# Patient Record
Sex: Female | Born: 1950
Health system: Southern US, Community
[De-identification: ages and names within clinical notes are randomized; demographics above are authoritative.]

## PROBLEM LIST (undated history)

## (undated) DIAGNOSIS — H532 Diplopia: Secondary | ICD-10-CM

## (undated) DIAGNOSIS — J4530 Mild persistent asthma, uncomplicated: Secondary | ICD-10-CM

## (undated) DIAGNOSIS — F419 Anxiety disorder, unspecified: Secondary | ICD-10-CM

## (undated) DIAGNOSIS — M1990 Unspecified osteoarthritis, unspecified site: Secondary | ICD-10-CM

## (undated) DIAGNOSIS — M5126 Other intervertebral disc displacement, lumbar region: Secondary | ICD-10-CM

## (undated) DIAGNOSIS — IMO0002 Reserved for concepts with insufficient information to code with codable children: Secondary | ICD-10-CM

## (undated) DIAGNOSIS — U071 COVID-19: Secondary | ICD-10-CM

## (undated) DIAGNOSIS — K635 Polyp of colon: Secondary | ICD-10-CM

## (undated) DIAGNOSIS — M779 Enthesopathy, unspecified: Secondary | ICD-10-CM

## (undated) DIAGNOSIS — E162 Hypoglycemia, unspecified: Secondary | ICD-10-CM

## (undated) DIAGNOSIS — M545 Low back pain, unspecified: Secondary | ICD-10-CM

## (undated) DIAGNOSIS — K5790 Diverticulosis of intestine, part unspecified, without perforation or abscess without bleeding: Secondary | ICD-10-CM

## (undated) DIAGNOSIS — F329 Major depressive disorder, single episode, unspecified: Secondary | ICD-10-CM

## (undated) DIAGNOSIS — J329 Chronic sinusitis, unspecified: Secondary | ICD-10-CM

## (undated) DIAGNOSIS — J189 Pneumonia, unspecified organism: Secondary | ICD-10-CM

## (undated) DIAGNOSIS — R7303 Prediabetes: Secondary | ICD-10-CM

## (undated) DIAGNOSIS — T7840XA Allergy, unspecified, initial encounter: Secondary | ICD-10-CM

## (undated) DIAGNOSIS — K219 Gastro-esophageal reflux disease without esophagitis: Secondary | ICD-10-CM

## (undated) DIAGNOSIS — F32A Depression, unspecified: Secondary | ICD-10-CM

## (undated) HISTORY — DX: Polyp of colon: K63.5

## (undated) HISTORY — DX: Reserved for concepts with insufficient information to code with codable children: IMO0002

## (undated) HISTORY — PX: FOOT SURGERY: SHX648

## (undated) HISTORY — DX: COVID-19: U07.1

## (undated) HISTORY — DX: Diplopia: H53.2

## (undated) HISTORY — PX: SHOULDER SURGERY: SHX246

## (undated) HISTORY — DX: Allergy, unspecified, initial encounter: T78.40XA

## (undated) HISTORY — DX: Mild persistent asthma, uncomplicated: J45.30

## (undated) HISTORY — DX: Low back pain, unspecified: M54.50

## (undated) HISTORY — DX: Depression, unspecified: F32.A

## (undated) HISTORY — PX: TUBAL LIGATION: SHX77

## (undated) HISTORY — DX: Chronic sinusitis, unspecified: J32.9

## (undated) HISTORY — DX: Gastro-esophageal reflux disease without esophagitis: K21.9

## (undated) HISTORY — DX: Pneumonia, unspecified organism: J18.9

## (undated) HISTORY — DX: Prediabetes: R73.03

## (undated) HISTORY — DX: Low back pain: M54.5

## (undated) HISTORY — DX: Diverticulosis of intestine, part unspecified, without perforation or abscess without bleeding: K57.90

## (undated) HISTORY — DX: Major depressive disorder, single episode, unspecified: F32.9

## (undated) HISTORY — DX: Hypoglycemia, unspecified: E16.2

## (undated) HISTORY — DX: Anxiety disorder, unspecified: F41.9

---

## 1998-04-02 ENCOUNTER — Other Ambulatory Visit: Admission: RE | Admit: 1998-04-02 | Discharge: 1998-04-02 | Payer: Self-pay | Admitting: Obstetrics and Gynecology

## 1998-06-01 ENCOUNTER — Other Ambulatory Visit: Admission: RE | Admit: 1998-06-01 | Discharge: 1998-06-01 | Payer: Self-pay | Admitting: Obstetrics and Gynecology

## 1998-08-15 ENCOUNTER — Ambulatory Visit (HOSPITAL_COMMUNITY): Admission: RE | Admit: 1998-08-15 | Discharge: 1998-08-15 | Payer: Self-pay | Admitting: Obstetrics and Gynecology

## 1999-02-01 ENCOUNTER — Other Ambulatory Visit: Admission: RE | Admit: 1999-02-01 | Discharge: 1999-02-01 | Payer: Self-pay | Admitting: Obstetrics and Gynecology

## 2000-06-23 ENCOUNTER — Other Ambulatory Visit: Admission: RE | Admit: 2000-06-23 | Discharge: 2000-06-23 | Payer: Self-pay | Admitting: Obstetrics and Gynecology

## 2000-12-18 ENCOUNTER — Other Ambulatory Visit: Admission: RE | Admit: 2000-12-18 | Discharge: 2000-12-18 | Payer: Self-pay | Admitting: Obstetrics and Gynecology

## 2002-01-07 ENCOUNTER — Encounter: Payer: Self-pay | Admitting: Internal Medicine

## 2002-01-07 ENCOUNTER — Ambulatory Visit (HOSPITAL_COMMUNITY): Admission: RE | Admit: 2002-01-07 | Discharge: 2002-01-07 | Payer: Self-pay | Admitting: Internal Medicine

## 2002-04-27 ENCOUNTER — Encounter: Admission: RE | Admit: 2002-04-27 | Discharge: 2002-04-27 | Payer: Self-pay | Admitting: Neurosurgery

## 2002-04-27 ENCOUNTER — Encounter: Payer: Self-pay | Admitting: Neurosurgery

## 2002-05-11 ENCOUNTER — Encounter: Admission: RE | Admit: 2002-05-11 | Discharge: 2002-05-11 | Payer: Self-pay | Admitting: Neurosurgery

## 2002-05-11 ENCOUNTER — Encounter: Payer: Self-pay | Admitting: Neurosurgery

## 2002-06-02 HISTORY — PX: UPPER GASTROINTESTINAL ENDOSCOPY: SHX188

## 2002-06-08 ENCOUNTER — Encounter: Admission: RE | Admit: 2002-06-08 | Discharge: 2002-06-08 | Payer: Self-pay | Admitting: Neurosurgery

## 2002-06-08 ENCOUNTER — Encounter: Payer: Self-pay | Admitting: Neurosurgery

## 2002-06-29 ENCOUNTER — Ambulatory Visit (HOSPITAL_COMMUNITY): Admission: RE | Admit: 2002-06-29 | Discharge: 2002-06-29 | Payer: Self-pay | Admitting: Internal Medicine

## 2002-06-29 ENCOUNTER — Encounter: Payer: Self-pay | Admitting: Internal Medicine

## 2004-09-18 ENCOUNTER — Emergency Department (HOSPITAL_COMMUNITY): Admission: EM | Admit: 2004-09-18 | Discharge: 2004-09-18 | Payer: Self-pay | Admitting: Emergency Medicine

## 2005-02-15 ENCOUNTER — Emergency Department (HOSPITAL_COMMUNITY): Admission: EM | Admit: 2005-02-15 | Discharge: 2005-02-16 | Payer: Self-pay | Admitting: Emergency Medicine

## 2005-02-19 ENCOUNTER — Ambulatory Visit: Payer: Self-pay | Admitting: Internal Medicine

## 2005-03-03 DIAGNOSIS — K635 Polyp of colon: Secondary | ICD-10-CM

## 2005-03-03 HISTORY — DX: Polyp of colon: K63.5

## 2005-10-30 ENCOUNTER — Ambulatory Visit: Payer: Self-pay | Admitting: Internal Medicine

## 2005-11-01 HISTORY — PX: COLONOSCOPY W/ POLYPECTOMY: SHX1380

## 2005-11-05 ENCOUNTER — Ambulatory Visit: Payer: Self-pay | Admitting: Internal Medicine

## 2005-11-13 ENCOUNTER — Ambulatory Visit: Payer: Self-pay | Admitting: Internal Medicine

## 2005-11-14 ENCOUNTER — Ambulatory Visit: Payer: Self-pay | Admitting: Internal Medicine

## 2005-11-27 ENCOUNTER — Ambulatory Visit: Payer: Self-pay | Admitting: Internal Medicine

## 2005-11-27 ENCOUNTER — Encounter (INDEPENDENT_AMBULATORY_CARE_PROVIDER_SITE_OTHER): Payer: Self-pay | Admitting: Specialist

## 2005-12-22 ENCOUNTER — Ambulatory Visit: Payer: Self-pay | Admitting: Internal Medicine

## 2006-01-12 ENCOUNTER — Ambulatory Visit: Payer: Self-pay | Admitting: Internal Medicine

## 2006-01-12 LAB — CONVERTED CEMR LAB
Fecal Occult Blood: NEGATIVE
OCCULT 1: NEGATIVE
OCCULT 2: NEGATIVE
OCCULT 3: NEGATIVE
OCCULT 4: NEGATIVE
OCCULT 5: NEGATIVE

## 2006-04-07 ENCOUNTER — Ambulatory Visit: Admission: RE | Admit: 2006-04-07 | Discharge: 2006-04-07 | Payer: Self-pay | Admitting: Gynecologic Oncology

## 2006-05-05 ENCOUNTER — Ambulatory Visit (HOSPITAL_COMMUNITY): Admission: RE | Admit: 2006-05-05 | Discharge: 2006-05-05 | Payer: Self-pay | Admitting: Obstetrics and Gynecology

## 2006-05-05 ENCOUNTER — Encounter (INDEPENDENT_AMBULATORY_CARE_PROVIDER_SITE_OTHER): Payer: Self-pay | Admitting: Specialist

## 2006-05-05 HISTORY — PX: LAPAROSCOPIC SALPINGOOPHERECTOMY: SUR795

## 2006-06-02 ENCOUNTER — Ambulatory Visit: Admission: RE | Admit: 2006-06-02 | Discharge: 2006-06-02 | Payer: Self-pay | Admitting: Gynecologic Oncology

## 2007-03-31 ENCOUNTER — Encounter: Payer: Self-pay | Admitting: Internal Medicine

## 2007-04-06 ENCOUNTER — Ambulatory Visit (HOSPITAL_BASED_OUTPATIENT_CLINIC_OR_DEPARTMENT_OTHER): Admission: RE | Admit: 2007-04-06 | Discharge: 2007-04-06 | Payer: Self-pay | Admitting: Orthopedic Surgery

## 2007-04-06 ENCOUNTER — Encounter: Payer: Self-pay | Admitting: Internal Medicine

## 2007-09-27 ENCOUNTER — Ambulatory Visit: Payer: Self-pay | Admitting: Internal Medicine

## 2007-09-28 LAB — CONVERTED CEMR LAB
ALT: 21 units/L (ref 0–35)
AST: 20 units/L (ref 0–37)
Albumin: 3.9 g/dL (ref 3.5–5.2)
Alkaline Phosphatase: 53 units/L (ref 39–117)
BUN: 12 mg/dL (ref 6–23)
Basophils Absolute: 0 10*3/uL (ref 0.0–0.1)
Basophils Relative: 0.7 % (ref 0.0–3.0)
Bilirubin Urine: NEGATIVE
Bilirubin, Direct: 0.1 mg/dL (ref 0.0–0.3)
CO2: 29 meq/L (ref 19–32)
Calcium: 9.4 mg/dL (ref 8.4–10.5)
Chloride: 108 meq/L (ref 96–112)
Cholesterol: 185 mg/dL (ref 0–200)
Creatinine, Ser: 0.7 mg/dL (ref 0.4–1.2)
Eosinophils Absolute: 0.3 10*3/uL (ref 0.0–0.7)
Eosinophils Relative: 7.1 % — ABNORMAL HIGH (ref 0.0–5.0)
GFR calc Af Amer: 112 mL/min
GFR calc non Af Amer: 93 mL/min
Glucose, Bld: 104 mg/dL — ABNORMAL HIGH (ref 70–99)
HCT: 39.5 % (ref 36.0–46.0)
HDL: 42.2 mg/dL (ref >39.0)
Hemoglobin: 13.3 g/dL (ref 12.0–15.0)
Ketones, ur: NEGATIVE mg/dL
LDL Cholesterol: 136 mg/dL — ABNORMAL HIGH (ref 0–99)
Leukocytes, UA: NEGATIVE
Lymphocytes Relative: 42.2 % (ref 12.0–46.0)
MCHC: 33.5 g/dL (ref 30.0–36.0)
MCV: 95.2 fL (ref 78.0–100.0)
Monocytes Absolute: 0.6 10*3/uL (ref 0.1–1.0)
Monocytes Relative: 12.3 % — ABNORMAL HIGH (ref 3.0–12.0)
Neutro Abs: 1.8 10*3/uL (ref 1.4–7.7)
Neutrophils Relative %: 37.7 % — ABNORMAL LOW (ref 43.0–77.0)
Nitrite: NEGATIVE
Platelets: 240 10*3/uL (ref 150–400)
Potassium: 4.4 meq/L (ref 3.5–5.1)
RBC: 4.15 M/uL (ref 3.87–5.11)
RDW: 12 % (ref 11.5–14.6)
Sodium: 144 meq/L (ref 135–145)
Specific Gravity, Urine: 1.02 (ref 1.000–1.03)
TSH: 2.56 microintl units/mL (ref 0.35–5.50)
Total Bilirubin: 0.7 mg/dL (ref 0.3–1.2)
Total CHOL/HDL Ratio: 4.4
Total Protein, Urine: NEGATIVE mg/dL
Total Protein: 6.6 g/dL (ref 6.0–8.3)
Triglycerides: 32 mg/dL (ref 0–149)
Urine Glucose: NEGATIVE mg/dL
Urobilinogen, UA: 0.2 (ref 0.0–1.0)
VLDL: 6 mg/dL (ref 0–40)
WBC: 4.9 10*3/uL (ref 4.5–10.5)
pH: 6 (ref 5.0–8.0)

## 2007-09-30 ENCOUNTER — Ambulatory Visit: Payer: Self-pay | Admitting: Internal Medicine

## 2007-09-30 DIAGNOSIS — M545 Low back pain, unspecified: Secondary | ICD-10-CM | POA: Insufficient documentation

## 2007-09-30 DIAGNOSIS — J309 Allergic rhinitis, unspecified: Secondary | ICD-10-CM | POA: Insufficient documentation

## 2007-09-30 DIAGNOSIS — K219 Gastro-esophageal reflux disease without esophagitis: Secondary | ICD-10-CM | POA: Insufficient documentation

## 2007-12-15 ENCOUNTER — Encounter: Payer: Self-pay | Admitting: Internal Medicine

## 2007-12-15 ENCOUNTER — Ambulatory Visit: Payer: Self-pay | Admitting: Internal Medicine

## 2007-12-21 ENCOUNTER — Telehealth: Payer: Self-pay | Admitting: Internal Medicine

## 2008-01-02 ENCOUNTER — Emergency Department (HOSPITAL_COMMUNITY): Admission: EM | Admit: 2008-01-02 | Discharge: 2008-01-02 | Payer: Self-pay | Admitting: Emergency Medicine

## 2008-01-02 ENCOUNTER — Encounter: Payer: Self-pay | Admitting: Internal Medicine

## 2008-01-02 DIAGNOSIS — R079 Chest pain, unspecified: Secondary | ICD-10-CM | POA: Insufficient documentation

## 2008-01-10 ENCOUNTER — Encounter: Payer: Self-pay | Admitting: Internal Medicine

## 2008-01-10 ENCOUNTER — Ambulatory Visit: Payer: Self-pay

## 2008-04-18 ENCOUNTER — Ambulatory Visit: Payer: Self-pay | Admitting: Internal Medicine

## 2008-04-18 DIAGNOSIS — J45909 Unspecified asthma, uncomplicated: Secondary | ICD-10-CM | POA: Insufficient documentation

## 2008-04-18 DIAGNOSIS — J209 Acute bronchitis, unspecified: Secondary | ICD-10-CM | POA: Insufficient documentation

## 2008-04-18 DIAGNOSIS — H532 Diplopia: Secondary | ICD-10-CM | POA: Insufficient documentation

## 2008-04-19 ENCOUNTER — Telehealth (INDEPENDENT_AMBULATORY_CARE_PROVIDER_SITE_OTHER): Payer: Self-pay | Admitting: *Deleted

## 2008-04-19 ENCOUNTER — Telehealth: Payer: Self-pay | Admitting: Internal Medicine

## 2009-01-03 ENCOUNTER — Encounter: Payer: Self-pay | Admitting: Internal Medicine

## 2010-04-04 NOTE — Consult Note (Signed)
Summary: [facility]  [facility]   Imported By: Esmeralda Links D'jimraou 04/20/2007 15:09:32  _____________________________________________________________________  External Attachment:    Type:   Image     Comment:   External Document

## 2010-04-04 NOTE — Miscellaneous (Signed)
Summary: Orders Update  Clinical Lists Changes  Problems: Added new problem of CHEST PAIN (ICD-786.50) Orders: Added new Referral order of Cardiolite (Cardiolite) - Signed

## 2010-04-04 NOTE — Progress Notes (Signed)
Summary: results  Phone Note Call from Patient Call back at Home Phone [phone]   Caller: Patient Summary of Call: pt requesting results from bone density Initial call taken by: Migdalia Dk,  December 21, 2007 4:37 PM  Follow-up for Phone Call        I don't have it Follow-up by: Tresa Garter MD,  December 22, 2007 12:36 PM  Additional Follow-up for Phone Call Additional follow up Details #1::        Report not in emr yet Additional Follow-up by: Lamar Sprinkles,  December 22, 2007 2:11 PM    Additional Follow-up for Phone Call Additional follow up Details #2::    Report scanned in ................Marland KitchenLamar Sprinkles  December 28, 2007 9:48 AM   Additional Follow-up for Phone Call Additional follow up Details #3:: Details for Additional Follow-up Action Taken: Mild osteopeniia. Take Vit D and exercise.  Pt informed ..............Marland KitchenLamar Sprinkles  December 29, 2007 3:49 PM  Additional Follow-up by: Tresa Garter MD,  December 28, 2007 5:33 PM

## 2010-04-04 NOTE — Assessment & Plan Note (Signed)
Summary: CPX/$50/BCBS/PN   Vital Signs:  Patient Profile:   60 Years Old Female Weight:      163.0 pounds Temp:     97.5 degrees F oral Pulse rate:   60 / minute BP sitting:   118 / 70  (left arm) Cuff size:   regular  Pt. in pain?   no  Vitals Entered By: Rock Nephew CMA (September 30, 2007 10:59 AM)                  Chief Complaint:  cpx.  History of Present Illness: The patient presents for a wellness examination     Past Medical History:    Low back pain    L foot lig tear Dr Antony Odea    Esoph stricture  Dr Leone Payor    GERD    Allergic rhinitis    Gyn Dr Ambrose Mantle   Family History:    M MM  Social History:    Occupation: office    Married re    Never Smoked    Alcohol use-no   Risk Factors:  Tobacco use:  never Alcohol use:  no   Review of Systems  The patient denies anorexia, fever, weight loss, weight gain, vision loss, decreased hearing, hoarseness, chest pain, syncope, dyspnea on exertion, peripheral edema, prolonged cough, headaches, hemoptysis, abdominal pain, melena, hematochezia, severe indigestion/heartburn, hematuria, incontinence, genital sores, muscle weakness, suspicious skin lesions, transient blindness, difficulty walking, depression, unusual weight change, abnormal bleeding, enlarged lymph nodes, angioedema, and breast masses.     Physical Exam  General:     Well-developed,well-nourished,in no acute distress; alert,appropriate and cooperative throughout examination Head:     Normocephalic and atraumatic without obvious abnormalities. No apparent alopecia or balding. Eyes:     No corneal or conjunctival inflammation noted. EOMI. Perrla. Funduscopic exam benign, without hemorrhages, exudates or papilledema. Vision grossly normal. Ears:     External ear exam shows no significant lesions or deformities.  Otoscopic examination reveals clear canals, tympanic membranes are intact bilaterally without bulging, retraction, inflammation or  discharge. Hearing is grossly normal bilaterally. Nose:     External nasal examination shows no deformity or inflammation. Nasal mucosa are pink and moist without lesions or exudates. Mouth:     Oral mucosa and oropharynx without lesions or exudates.  Teeth in good repair. Neck:     No deformities, masses, or tenderness noted. Lungs:     Normal respiratory effort, chest expands symmetrically. Lungs are clear to auscultation, no crackles or wheezes. Heart:     Normal rate and regular rhythm. S1 and S2 normal without gallop, murmur, click, rub or other extra sounds. Abdomen:     Bowel sounds positive,abdomen soft and non-tender without masses, organomegaly or hernias noted. Msk:     No deformity or scoliosis noted of thoracic or lumbar spine.   Pulses:     R and L carotid,radial,femoral,dorsalis pedis and posterior tibial pulses are full and equal bilaterally Extremities:     No clubbing, cyanosis, edema, or deformity noted with normal full range of motion of all joints.   Neurologic:     No cranial nerve deficits noted. Station and gait are normal. Plantar reflexes are down-going bilaterally. DTRs are symmetrical throughout. Sensory, motor and coordinative functions appear intact. Skin:     Intact without suspicious lesions or rashes Cervical Nodes:     No lymphadenopathy noted Inguinal Nodes:     No significant adenopathy Psych:     Cognition and judgment appear intact. Alert  and cooperative with normal attention span and concentration. No apparent delusions, illusions, hallucinations    Impression & Recommendations:  Problem # 1:  Preventive Health Care (ICD-V70.0) Age related issues discussed  Problem # 2:  GERD (ICD-530.81) On Rx  Problem # 3:  ALLERGIC RHINITIS (ICD-477.9) Assessment: Unchanged  Her updated medication list for this problem includes:    Flonase 50 Mcg/act Susp (Fluticasone propionate) ..... Use 1-2 spray once daily prn   Problem # 4:  LOW BACK PAIN  (ICD-724.2) Assessment: Improved  Complete Medication List: 1)  Flonase 50 Mcg/act Susp (Fluticasone propionate) .... Use 1-2 spray once daily prn 2)  Vitamin D3 1000 Unit Tabs (Cholecalciferol) .Marland Kitchen.. 1 by mouth daily 3)  Mebendazole 100 Mg Chew (Mebendazole) .Marland Kitchen.. 1 by mouth bid   Patient Instructions: 1)  Please schedule a follow-up appointment in 1 year well/labs.   Prescriptions: MEBENDAZOLE 100 MG  CHEW (MEBENDAZOLE) 1 by mouth bid  #6 x 0   Entered and Authorized by:   Tresa Garter MD   Signed by:   Tresa Garter MD on 09/30/2007   Method used:   Print then Give to Patient   RxID:   [id number] FLONASE 50 MCG/ACT  SUSP (FLUTICASONE PROPIONATE) use 1-2 spray once daily prn  #3 x 3   Entered and Authorized by:   Tresa Garter MD   Signed by:   Tresa Garter MD on 09/30/2007   Method used:   Print then Give to Patient   RxID:   [id number]  ]

## 2010-04-04 NOTE — Progress Notes (Signed)
Summary: Appointment  Phone Note Outgoing Call   Call placed by: Dagoberto Reef,  April 19, 2008 8:57 AM Summary of Call: Dr Posey Rea, Dr Dagoberto Ligas is not accepting new patients made pt an appt with Dr Janet Berlin on 05/05/08 @ 2:30pm.Is this ok ? Initial call taken by: Dagoberto Reef,  April 19, 2008 8:58 AM  Follow-up for Phone Call        OK. Thx! Follow-up by: Tresa Garter MD,  April 19, 2008 1:02 PM

## 2010-04-04 NOTE — Consult Note (Signed)
Summary: [facility]  [facility]   Imported By: Esmeralda Links D'jimraou 04/08/2007 15:28:22  _____________________________________________________________________  External Attachment:    Type:   Image     Comment:   External Document

## 2010-04-04 NOTE — Assessment & Plan Note (Signed)
Summary: WHEEZY COUGH X 4 WKS---$50---STC   Vital Signs:  Patient Profile:   60 Years Old Female Weight:      173 pounds Temp:     97.6 degrees F oral Pulse rate:   80 / minute BP sitting:   120 / 80  (left arm)  Vitals Entered By: Tora Perches (April 18, 2008 4:28 PM)                 Chief Complaint:  Multiple medical problems or concerns.  History of Present Illness: The patient presents with complaints of sore throat, fever, cough, sinus congestion and drainge of 4 wks duration. Not better with OTC meds. Chest hurts with coughing. Can't sleep due to cough. Muscle aches are present.  The mucus is colored.     Prior Medications Reviewed Using: Patient Recall  Updated Prior Medication List: FLONASE 50 MCG/ACT  SUSP (FLUTICASONE PROPIONATE) use 1-2 spray once daily prn VITAMIN D3 1000 UNIT  TABS (CHOLECALCIFEROL) 1 by mouth daily  Current Allergies (reviewed today): ! SULFA  Past Medical History:    Reviewed history from 09/30/2007 and no changes required:       Low back pain       L foot lig tear Dr Antony Odea       Esoph stricture  Dr Leone Payor       GERD       Allergic rhinitis       Gyn Dr Ambrose Mantle       Asthma       L eye cataract   Family History:    Reviewed history from 09/30/2007 and no changes required:       M MM  Social History:    Reviewed history from 09/30/2007 and no changes required:       Occupation: office       Married re       Never Smoked       Alcohol use-no    Review of Systems       The patient complains of dyspnea on exertion and prolonged cough.  The patient denies fever.     Physical Exam  General:     Well-developed,well-nourished,in no acute distress; alert,appropriate and cooperative throughout examination Eyes:     L eye is deviating vision grossly intact, pupils equal, pupils round, and pupils reactive to light.   Ears:     External ear exam shows no significant lesions or deformities.  Otoscopic examination  reveals clear canals, tympanic membranes are intact bilaterally without bulging, retraction, inflammation or discharge. Hearing is grossly normal bilaterally. Nose:     Erythematous throat mucosa and intranasal erythema.  Lungs:     CTA Heart:     RRR Abdomen:     Bowel sounds positive,abdomen soft and non-tender without masses, organomegaly or hernias noted. Msk:     No deformity or scoliosis noted of thoracic or lumbar spine.   Neurologic:     No cranial nerve deficits noted. Station and gait are normal. Plantar reflexes are down-going bilaterally. DTRs are symmetrical throughout. Sensory, motor and coordinative functions appear intact.    Impression & Recommendations:  Problem # 1:  BRONCHITIS, ACUTE (ICD-466.0) Assessment: New  Her updated medication list for this problem includes:    Levaquin 500 Mg Tabs (Levofloxacin) .Marland Kitchen... Take 1 tab by mouth daily    Promethazine-codeine 6.25-10 Mg/78ml Syrp (Promethazine-codeine) .Marland Kitchen... Take 5-32mls by mouth every 4-6 hours as needed for cough    Proair Hfa 108 (90  Base) Mcg/act Aers (Albuterol sulfate) .Marland Kitchen... 2 inh q4h as needed shortness of breath  Orders: T-2 View CXR, Same Day (71020.5TC)   Problem # 2:  ASTHMA (ICD-493.90) Assessment: Deteriorated Predn Her updated medication list for this problem includes:    Prednisone 10 Mg Tabs (Prednisone) .Marland Kitchen... Take 40mg  qd for 3 days, then 20 mg qd for 3 days, then 10mg  qd for 6 days, then stop. take pc.    Proair Hfa 108 (90 Base) Mcg/act Aers (Albuterol sulfate) .Marland Kitchen... 2 inh q4h as needed shortness of breath   Problem # 3:  DIPLOPIA (ICD-368.2) Assessment: Deteriorated  Orders: Ophthalmology Referral (Ophthalmology)   Problem # 4:  CHEST PAIN (ICD-786.50) Assessment: New  Orders: T-2 View CXR, Same Day (71020.5TC)   Complete Medication List: 1)  Flonase 50 Mcg/act Susp (Fluticasone propionate) .... Use 1-2 spray once daily prn 2)  Vitamin D3 1000 Unit Tabs (Cholecalciferol)  .Marland Kitchen.. 1 by mouth daily 3)  Prednisone 10 Mg Tabs (Prednisone) .... Take 40mg  qd for 3 days, then 20 mg qd for 3 days, then 10mg  qd for 6 days, then stop. take pc. 4)  Levaquin 500 Mg Tabs (Levofloxacin) .... Take 1 tab by mouth daily 5)  Promethazine-codeine 6.25-10 Mg/54ml Syrp (Promethazine-codeine) .... Take 5-22mls by mouth every 4-6 hours as needed for cough 6)  Proair Hfa 108 (90 Base) Mcg/act Aers (Albuterol sulfate) .... 2 inh q4h as needed shortness of breath   Patient Instructions: 1)  Call if you are not better in a reasonable ammount of time or if worse. Go to ER if feeling really bad!   Prescriptions: PROAIR HFA 108 (90 BASE) MCG/ACT  AERS (ALBUTEROL SULFATE) 2 inh q4h as needed shortness of breath  #1 x 3   Entered and Authorized by:   Tresa Garter MD   Signed by:   Tresa Garter MD on 04/18/2008   Method used:   Print then Give to Patient   RxID:   [id number] PROMETHAZINE-CODEINE 6.25-10 MG/5ML  SYRP (PROMETHAZINE-CODEINE) Take 5-69mls by mouth every 4-6 hours as needed for cough  #333mls x 0   Entered and Authorized by:   Tresa Garter MD   Signed by:   Tresa Garter MD on 04/18/2008   Method used:   Print then Give to Patient   RxID:   [id number] LEVAQUIN 500 MG TABS (LEVOFLOXACIN) Take 1 tab by mouth daily  #10 x 1   Entered and Authorized by:   Tresa Garter MD   Signed by:   Tresa Garter MD on 04/18/2008   Method used:   Print then Give to Patient   RxID:   [id number] PREDNISONE 10 MG  TABS (PREDNISONE) Take 40mg  qd for 3 days, then 20 mg qd for 3 days, then 10mg  qd for 6 days, then stop. Take pc.  #24 x 0   Entered and Authorized by:   Tresa Garter MD   Signed by:   Tresa Garter MD on 04/18/2008   Method used:   Print then Give to Patient   RxID:   [id number]

## 2010-04-04 NOTE — Miscellaneous (Signed)
Summary: BONE DENSITY  Clinical Lists Changes  Orders: Added new Test order of T-Bone Densitometry (77080) - Signed Added new Test order of T-Lumbar Vertebral Assessment (77082) - Signed 

## 2010-04-04 NOTE — Miscellaneous (Signed)
Summary: Immunization Entry   Immunization History:  Influenza Immunization History:    Influenza:  fluvax 3+ (12/21/2008) vaccine given at [facility]  s. main in [place]/ [place]. PT/VG

## 2010-07-16 NOTE — Op Note (Signed)
NAMENARIA, ABBEY                 ACCOUNT NO.:  [id number]   MEDICAL RECORD NO.:  [id number]          PATIENT TYPE:  AMB   LOCATION:  DSC                          FACILITY:  [facility]   PHYSICIAN:  Leonides Grills, M.D.     DATE OF BIRTH:  [birth year]   DATE OF PROCEDURE:  04/06/2007  DATE OF DISCHARGE:                               OPERATIVE REPORT   PREOPERATIVE DIAGNOSES:  1. Left peroneus brevis tendon tear.  2. Left subluxing peroneal tendons.  3. Left cavus foot.   POSTOPERATIVE DIAGNOSES:  1. Left peroneus brevis tendon tear.  2. Left subluxing peroneal tendons.  3. Left cavus foot.   OPERATION:  1. Repair of left subluxing peroneal tendons with fibular osteotomy.  2. Repair of left peroneus brevis tendon tear.  3. Left peroneus longus to peroneus brevis tendon transfer.  4. Left dorsiflexion first metatarsal osteotomy.  5. Stress x-rays, left foot.   ANESTHESIA:  General.   SURGEON:  Leonides Grills, M.D.   ASSISTANT:  Richardean Canal, P.A.-C.   ESTIMATED BLOOD LOSS:  Minimal.   TOURNIQUET TIME:  Approximately an hour and a half.   COMPLICATIONS:  None.   DISPOSITION:  Stable to PR.   INDICATIONS:  This is a 60 year old female with a cavus foot deformity  and posterolateral ankle pain secondary to peroneus brevis tendon tear  and local synovitis and subluxation of her tendons.  She was consented  for the above procedure.  All risks, including infection, nerve or  vessel injury, persistent pain, worsening pain, prolonged recovery,  weakness, stiffness were all explained.  Questions were encouraged and  answered.   OPERATION:  The patient was brought to the operating room and placed in  the supine position.  After adequate general endotracheal tube  anesthesia had been administered as well as Ancef 1 gram IV piggyback, a  beanbag was used as a bump, and she was placed in a sloppy lateral  position with the operative side up.  All bony prominences were well-  padded.   The left lower extremity was then prepped and draped in a  sterile manner over a proximally placed thigh tourniquet.  The limb was  gravity exsanguinated, and the  tourniquet was elevated to 290 mmHg.   A curvilinear incision made over the left peroneal tendons.  Dissection  was carried down through skin.  Hemostasis was obtained.  The peroneal  retinaculum was incised approximately 2 mm from the posterolateral edge  of the lateral malleolus.  There was a large amount of synovitis within  the tendons.  The peroneus brevis tendon was very flat, thickened, and  had a tear in its substance.  Also, the muscle belly of the peroneus  brevis was distalized as well down to the tip of the lateral malleolus.  The local synovitis was then debrided, and a formal tenosynovectomy and  partial removal of the muscle belly to about 3-4 cm proximal to the tip  of the lateral malleolus.  We then debrided any diseased tissue from the  tear and then dissected down distally to both tendons, releasing the  inferior peroneal retinaculum as well.  We then dissected the peroneus  longus down to the cuboid group and tenotomized it in this area.  We  then repaired the peroneus brevis tear using 2-0 FiberWire stitch and  then we transferred the peroneus longus to peroneus brevis using 2-0  FiberWire stitch.  This had an Conservation officer, historic buildings.  This was transferred  with the ankle in neutral dorsiflexion.  The area was copiously  irrigated with normal saline.  We then palpated the posterior aspect of  the lateral malleolus.  There was concavity to this area.  We then did a  fibular osteotomy as described by Dr. Tillman Sers where we actually  drilled with a 3.5-mm drill up the lateral malleolus and then tamped the  posterior convexity into a concavity and created a beautiful area for  the tendon on the posterior aspect of the lateral malleolus.  We then  also prepared the posterolateral edge of the lateral malleolus  with a  curved quarter-inch osteotome and rongeur and then placed multiple drill  holes on the posterolateral edge of the lateral malleolus.  We then  copiously irrigated the area with normal saline, tucked the tendons back  into place, and then repaired the peroneal retinaculum using 2-0  FiberWire stitch into this trough that was created.  This had an  Conservation officer, historic buildings.  We ranged the ankle, and there was excellent range  of motion and excursion of the tendons.  We then under C-arm guidance  identified the first TMT joint.  We then made a longitudinal incision  just distal to this.  This was made dorsal medial on the base of the  first metatarsal just medial to the EHL tendon.  We protected the EHL  tendon within its respective sheath.  Subperiosteal elevation was done  on the base of the first metatarsal.  We then identified approximately a  centimeter or so distal to the first TMT joint, and a closing wedge  osteotomy was then made with a sagittal saw.  This bone resection was  removed, and with a two-point reduction clamp, we then closed down the  osteotomy.  We then applied a mini frag stainless steel Synthes four-  hole T plate dorsally and then fixed this with four 2.0-mm fully  threaded cortical set screws using 1.5-mm drill holes, respectively.  This had excellent purchase and maintenance of the correction across the  osteotomy site.  We obtained stress x-ray in PA and lateral planes which  showed no gross motion, fixation and proposition and excellent as well  at the osteotomy.  The tourniquet was deflated.  Hemostasis was  obtained.  All wounds were irrigated with normal saline.  The subcu was  closed with 3-0 Vicryl, and the skin was closed with 4-0 nylon.  A  sterile dressing was applied.  A modified Jones dressing was applied  with the ankle in dorsiflexion.  The patient was stable to the PR.      Leonides Grills, M.D.  Electronically Signed     PB/MEDQ  D:   04/06/2007  T:  04/06/2007  Job:  [id number]

## 2010-07-19 NOTE — Assessment & Plan Note (Signed)
[facility]                           GASTROENTEROLOGY OFFICE NOTE   NAME:[name] [name] EVEN                        MRN:          [id number]  DATE:12/22/2005                            DOB:          15-May-1950    [name] returns after an incomplete colonoscopy.  On November 27, 2005,  she had a screening colonoscopy that showed a 4-mm hyperplastic polyp.  I  got to the transverse colon, I think.  She had an extremely redundant left  colon.  It was very difficult.  I spent 30 minutes trying the enter the  colon.  We switched scopes.  Despite 50 mcg fentanyl and 9 mg versed she  says she remembers all of it.  There is no family history of colon cancer.   ASSESSMENT:  Negative incomplete screening colonoscopy.  She is 60 years old  with no increased risk factors or symptoms.   Options are repeat a colonoscopy - perhaps with the Spirus device, CT  colonoscopy, barium enema, hemoccults, or nothing.   After hearing a description of the risks and benefits of all, she has opted  to try hemoccults now.  If she is hemoccult positive we will pursue things  further at this time and determine a modality of investigation.  If not,  then will regroup in a year to see what options are available.  This is an  extremely difficult colonoscopy to perform, the Spirus device may provide  benefit.  At least approximately half of her colon is free of colorectal  neoplasia.  She understands the possibility of other colorectal neoplasia  remaining in the colon at this time, but has opted for this approach.       Iva Boop, MD,FACG      CEG/MedQ  DD:  12/22/2005  DT:  12/23/2005  Job #:  [id number]   cc:   Georgina Quint. Plotnikov, MD

## 2010-07-19 NOTE — Consult Note (Signed)
[name] [name], MUMFORD                 ACCOUNT NO.:  [id number]   MEDICAL RECORD NO.:  [id number]          PATIENT TYPE:  OUT   LOCATION:  GYN                          FACILITY:  [facility]   PHYSICIAN:  Paola A. Duard Brady, MD    DATE OF BIRTH:  [birth year]   DATE OF CONSULTATION:  06/02/2006  DATE OF DISCHARGE:                                 CONSULTATION   The patient is 60 year old who was seen by [place] secondary to an ultrasound  that revealed a left ovarian cyst measuring 6  x 5 x 4.75 cm.  It was  described as having low level echoes scattered throughout the lumen of  the mass.  There is a 2.2 x 2 cm lobulated vascular excrescence and a 7  mm wall calcification.  CA-125 was normal at 12.  After evaluation and  discussion she opted for laparoscopic approach in evaluation.  She  underwent a laparoscopic left salpingo-oophorectomy on May 05, 2006.  Based on the patient's request, the uterus and contralateral adnexa were  left in situ if this was a benign process and they appeared negative.  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  There is no malignancy.  The washings were negative.  She  comes in today for her postoperative check.  She complained of abdominal  pain and swelling for a few weeks.  She states she did not bounce back  as quickly she thought she would and has not yet returned to work.  She  is eating well and denies any other complaints.   PHYSICAL EXAMINATION:  Weight 146 pounds, well-nourished, well-developed  female in no acute distress.  ABDOMEN:  Shows well-healed laparoscopy skin incisions.  Abdomen is soft  and nontender.   ASSESSMENT:  This is a 60 year old status post laparoscopic left  oophorectomy for benign disease.  She is doing well and we will release  her to her usual activities.  We will also release her to go back to  work and a note was given to  her that effect.  She knows that we will  release her to Dr. Ambrose Mantle but we will  be happy to see her in the future  should the need arise.      Paola A. Duard Brady, MD  Electronically Signed     PAG/MEDQ  D:  06/02/2006  T:  06/02/2006  Job:  [id number]   cc:   Malachi Pro. Ambrose Mantle, M.D.  Fax: [phone]   Telford Nab, R.N.  [address]  [place], [place] [address]

## 2010-07-19 NOTE — Assessment & Plan Note (Signed)
[name] [name]                             PRIMARY CARE OFFICE NOTE   NAME:[name] [name] BLUMENTHAL                        MRN:          [id number]  DATE:11/05/2005                            DOB:          09-05-1950    The patient is a 60 year old female who presents for a wellness examination.   PAST MEDICAL HISTORY:  Low back pain and radiculopathy on the right side.  Esophageal stenosis.   FAMILY HISTORY:  Mother with multiple myeloma, now 4 years old.  Uncle just  had lung cancer.   SOCIAL HISTORY:  Nonsmoker.  No alcohol.   REVIEW OF SYSTEMS:  Occasional low back pain.  No chest pain or shortness of  breath, occasionally allergy symptoms.  No blood in the stools.  The rest is  negative.   PHYSICAL EXAMINATION:  VITAL SIGNS:  Blood pressure 94/64.  Pulse 66.  Temp  98.2.  Weight 146 pounds.  HEENT:  With moist mucosa.  NECK:  Supple.  No thyromegaly or bruits.  LUNGS:  Clear.  No wheezes or rales.  HEART:  S1, S2 normal with no gallop.  ABDOMEN:  Soft, nontender.  No organomegaly.  No mass felt.  EXTREMITIES:  Lower extremities without edema.  NEUROLOGIC:  She is alert, oriented, and cooperative.  Denies being  depressed.  SKIN:  Clear.   Labs on October 03, 2005, CBC normal, cholesterol 177, LDL 124, HDL 47,  glucose 956.  CMET normal.  TSH normal.  Urinalysis normal.  EKG is normal  today.   ASSESSMENT AND PLAN:  1. __________, health issues discussed.  Healthy lifestyle discussed.  She      will need a colonoscopy.  Start a baby aspirin daily.  Fish oil to      improve LDL.  Regular gynecology care with Dr. Ambrose [name].  We will obtain      bone density scan.  Advised to take calcium and vitamin D daily.  2. Low back pain.  She quit Celebrex.  Gave her tramadol 50 mg, 1 to 2,      q.i.d. p.r.n., however, she refused.  She will use Tylenol.  3. Allergic rhinitis.  She will use Allegra 180 mg daily p.r.n. and      Flonase nasal spray.  I will see  her back in 6 months.                                   [name] Quint. Plotnikov, MD   AVP/MedQ  DD:  11/07/2005  DT:  11/07/2005  Job #:  [id number]   cc:   [name] [name], M.D.

## 2010-07-19 NOTE — Op Note (Signed)
[name] [name], BRENT                 ACCOUNT NO.:  [id number]   MEDICAL RECORD NO.:  [id number]          PATIENT TYPE:  AMB   LOCATION:  DAY                          FACILITY:  [facility]   PHYSICIAN:  Paola A. Duard Brady, MD    DATE OF BIRTH:  [birth year]   DATE OF PROCEDURE:  05/05/2006  DATE OF DISCHARGE:                               OPERATIVE REPORT   PREOPERATIVE DIAGNOSIS:  Persistent complex left adnexal mass.   POSTOPERATIVE DIAGNOSIS:  Benign serous cystadenofibroma.   PROCEDURE:  Laparoscopic left salpingo-oophorectomy.   SURGEONS:  Paola A. Duard Brady, MD and Malachi Pro. Ambrose Mantle, M.D.   ANESTHESIA:  General.   ANESTHESIOLOGIST:  Jenelle Mages. Fortune, M.D.   SPECIMENS:  Left tube and ovary to pathology.   ESTIMATED BLOOD LOSS:  Minimal.   URINE OUTPUT:  300 mL.   IV FLUIDS:  2000 mL.   COMPLICATIONS:  None.   OPERATIVE FINDINGS:  Included  an 8 cm simple appearing left adnexal  mass.  The uterus, right adnexa and appendix were unremarkable.   The patient was taken to the operating room and placed in the supine  position and arms were tucked to her comfort level with all appropriate  precautions.  General anesthesia was induced without difficulty.  She  was then placed in dorsal lithotomy position.  The abdomen was prepped  in the usual sterile fashion.  The perineum was prepped in usual sterile  fashion as was the vagina.  Foley catheter was inserted into the bladder  under sterile conditions.  A sterile speculum was inserted into the  vagina.  The cervix was grasped with a single-tooth tenaculum and cervix  was dilated without difficulty.  The ZUMI was placed into the uterus.  Time-out was then performed to confirm the patient, the procedure, and  ALLERGY TO SULFA.  The patient was then draped.  A vertical umbilical  incision was made with a knife after injecting 0.25% Marcaine.  It was  carried down to the underlying fascia using Mayo scissors.  The fascia  was identified,  grasped with Kocher clamps, tented and entered sharply.  Once peritoneal cavity entry was confirmed UR6 suture was placed to  suture the fascial edges.  The Hasson was then placed.  The abdomen was  insufflated with CO2 gas.  Never did the patient's peak pressure exceed  50 mmHg.  She was then placed in Trendelenburg.  Operative findings as  above were noted.  We then proceeded to place our accessory ports.  After confirming the appropriate procedure, a 5 mm trocar was placed 2  cm superior and medial to the anterior-superior iliac spine.  The skin  was anesthetized with 0.25% Marcaine.  A 10/12 suprapubic port was  placed two fingerbreadths above the pubic symphysis and a right-sided  lateral 5-mm port was placed in the same manner as the left.  Again all  ports were placed under direct visualization.  The uterus was deviated  over towards the patient's right with the ZUMI.  Abdominal pelvic  washings were obtained without difficulty.  The broad ligament on the  patient's left side was opened using monopolar cautery.  The ureter was  identified in the retroperitoneal space.  A window was created between  the IP and the ureter.  The IP was then coagulated with bipolar cautery  and transected.  We then skeletonized the utero-ovarian pedicle.  The  fallopian tube and the utero-ovarian pedicle were coagulated with  bipolar cautery and transected with monopolar cautery.  An EndoCatch bag  was then placed through the 10/12 suprapubic port and the mass was  placed in the EndoCatch bag.  The EndoCatch was delivered up to the  skin.  The cyst was drained was no spillage into the abdomen and was  delivered through the 10/12 port.  The mass was sent for frozen section.  Frozen section returned as a serous cystadenofibroma.  At this time we  inspected all the pedicles under low flow and they were noted to be  hemostatic.  The pelvis was irrigated.  Under direct visualization all  accessory ports were  removed and the port sites were noted to be  hemostatic.  The CO2 was released from the abdomen and the [facility] port  was removed as was the camera.  The fascial incision at the umbilicus  was reapproximated with stay sutures and additional figure-of-eight  suture of zero Vicryl was used to close the umbilical incision.  The  subcu suture was placed at the 10/12 suprapubic port.  Both the 10/12  suprapubic port and the infraumbilical port skin was closed using a 4-0  Vicryl.  All other ports were closed with Steri-Strips.  Steri-Strips  and Band-Aids were placed with all ports.   The patient tolerated procedure well.  All needle, Ray-Tec counts were  correct x2.  She was taken to recovery room in stable condition.      Paola A. Duard Brady, MD  Electronically Signed     PAG/MEDQ  D:  05/05/2006  T:  05/05/2006  Job:  [id number]   cc:   Telford Nab, R.N.  [address]  [place], [place] [address]   Malachi Pro. Ambrose Mantle, M.D.  Fax: [phone]

## 2010-07-19 NOTE — Consult Note (Signed)
[name] [name], CARR                 ACCOUNT NO.:  [id number]   MEDICAL RECORD NO.:  [id number]          PATIENT TYPE:  OUT   LOCATION:  GYN                          FACILITY:  [facility]   PHYSICIAN:  John T. Kyla Balzarine, M.D.    DATE OF BIRTH:  1950/10/12   DATE OF CONSULTATION:  DATE OF DISCHARGE:                                 CONSULTATION   CHIEF COMPLAINT:  This 60 year old woman is seen at the request of Dr.  Ambrose Mantle for recommendations regarding management of a left ovarian cyst.   HISTORY OF PRESENT ILLNESS:  The patient had an abnormality noted on  pelvis, questionable enlarged uterus or adnexal mass.  Subsequent  ultrasound on March 09, 2006 revealed a left ovarian cyst measuring 6 x  approximately 5 x 4.75 cm. There was no free fluid, endometrial stripe  was 4.21 mm thick and the right ovary was normal.  The cyst was  described as smoothly marginated with faint low level echoes scattered  throughout the lumen.  A 2.2 x 2.0 cm lobulated vascular excrescence  was seen as well as a 7 mm wall calcification.  CA-125 value was 12.  The patient denies significant symptoms.  She has multiple questions  regarding surgical management.   PAST MEDICAL HISTORY:  Significant for no major comorbidities.  She is  status post BTL and NSVD x1.   MEDICATIONS:  Flonase p.r.n. environmental allergies.   ALLERGIES:  None.   PERSONAL SOCIAL HISTORY:  The patient is single, denies tobacco and  admits to occasional ethanol.   FAMILY HISTORY:  The patient's mother has multiple myeloma in her 37s  and a paternal cousin has premenopausal breast cancer.  No siblings or  parents with breast, gynecologic or colon cancer.   REVIEW OF SYSTEMS:  The patient went through menopause in approximately  2000 and was on hormone replacement therapy until approximately 2004.  She denies menopausal bleeding.  Otherwise negative in the remainder of  10 system review.   PHYSICAL EXAMINATION:  VITAL SIGNS:  Weight  151.5 pounds, blood pressure  104/68, pulse 84, respirations 18, temperature afebrile.  GENERAL:  The patient anxious, alert and oriented x3 in no acute  distress.  ENT:  Benign with clear oropharynx.  The extraocular movements are full  and there is no scleral icterus.  NECK:  Supple without goiter.  LUNG FIELDS:  Clear.  HEART:  Regular rate and rhythm with no JVD.  LYMPH NODE SURVEY:  Negative for pathologic lymphadenopathy.  BACK:  No back or CVA tenderness.  ABDOMEN:  Soft and benign with no ascites, mass, tenderness or hernia.  EXTREMITIES:  Full strength and range of motion without Homans, cords or  edema.  SKIN:  No suspicious lesions.  NEUROLOGIC SCREEN:  Intact.  PELVIC:  External genitalia and BUS normal to inspection and palpation.  The bladder and urethra are normal.  Vagina and cervix are without  lesions and there is no cervical motion tenderness.  Bimanual and  rectovaginal examinations reveals the cervix deviated anterior.  On  bimanual and rectovaginal examinations, there is a 6+ centimeter  cystic  and mildly tender mass best appreciated on rectovaginal.  No cul-de-sac  nodularity.   ASSESSMENT:  Menopausal adnexal cystic mass with internal excrescence  and normal CA-125.   RECOMMENDATIONS:  The patient wishes conservative treatment if possible.  I recommended a laparoscopic approach, which might be converted to an  open TAH/BSO with staging or debulking if ovarian malignancy were  encountered.  The patient is fairly adamant that she would wish only  removal of the affected tube and ovary if this were benign disease.  I  answered multiple questions regarding the procedure including the risks  and benefits.  The procedure will be scheduled to be performed by my  partner, Dr. Cleda Mccreedy, in conjunction with Dr. Ambrose Mantle.  The patient  is aware of this and consents to this.      John T. Kyla Balzarine, M.D.  Electronically Signed     JTS/MEDQ  D:  04/07/2006  T:   04/07/2006  Job:  [id number]   cc:   Malachi Pro. Ambrose Mantle, M.D.  Fax: [phone]

## 2010-09-16 ENCOUNTER — Encounter: Payer: Self-pay | Admitting: Internal Medicine

## 2010-09-17 ENCOUNTER — Encounter: Payer: Self-pay | Admitting: Internal Medicine

## 2010-09-17 ENCOUNTER — Ambulatory Visit (INDEPENDENT_AMBULATORY_CARE_PROVIDER_SITE_OTHER): Payer: BC Managed Care – PPO | Admitting: Internal Medicine

## 2010-09-17 DIAGNOSIS — K219 Gastro-esophageal reflux disease without esophagitis: Secondary | ICD-10-CM

## 2010-09-17 DIAGNOSIS — J019 Acute sinusitis, unspecified: Secondary | ICD-10-CM | POA: Insufficient documentation

## 2010-09-17 DIAGNOSIS — J45909 Unspecified asthma, uncomplicated: Secondary | ICD-10-CM

## 2010-09-17 DIAGNOSIS — J309 Allergic rhinitis, unspecified: Secondary | ICD-10-CM

## 2010-09-17 MED ORDER — FLUTICASONE PROPIONATE 50 MCG/ACT NA SUSP: 2.0000 | Freq: Every day | NASAL | Status: DC | PRN

## 2010-09-17 MED ORDER — LORATADINE 10 MG PO TABS: 10.0000 mg | ORAL_TABLET | Freq: Every day | ORAL | Status: DC
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
## 2010-09-17 NOTE — Assessment & Plan Note (Signed)
Start Rx 

## 2010-09-17 NOTE — Progress Notes (Signed)
  Subjective:    Patient ID: [name] [name], female    DOB: 07/06/1950, 60 y.o.   MRN: [id number]  HPI  C/o sinus thick and yellow d/c C/o wt/gain C/o GERD C/o congested breathing  Review of Systems  Constitutional: Negative for chills, activity change, appetite change, fatigue and unexpected weight change.  HENT: Negative for congestion, mouth sores and sinus pressure.   Eyes: Negative for visual disturbance.  Respiratory: Negative for cough and chest tightness.   Gastrointestinal: Negative for nausea and abdominal pain.  Genitourinary: Negative for frequency, difficulty urinating and vaginal pain.  Musculoskeletal: Negative for back pain and gait problem.  Skin: Negative for pallor and rash.  Neurological: Negative for dizziness, tremors, weakness, numbness and headaches.  Psychiatric/Behavioral: Negative for confusion and sleep disturbance.       Objective:   Physical Exam  Constitutional: She appears well-developed and well-nourished. No distress.  HENT:  Head: Normocephalic.  Right Ear: External ear normal.  Left Ear: External ear normal.  Nose: Nose normal.  Mouth/Throat: Oropharynx is clear and moist. No oropharyngeal exudate.       eryth nasal mucosa  Eyes: Conjunctivae are normal. Pupils are equal, round, and reactive to light. Right eye exhibits no discharge. Left eye exhibits no discharge.  Neck: Normal range of motion. Neck supple. No JVD present. No tracheal deviation present. No thyromegaly present.  Cardiovascular: Normal rate, regular rhythm and normal heart sounds.   Pulmonary/Chest: No stridor. No respiratory distress. She has no wheezes.  Abdominal: Soft. Bowel sounds are normal. She exhibits no distension and no mass. There is no tenderness. There is no rebound and no guarding.  Musculoskeletal: She exhibits no edema and no tenderness.  Lymphadenopathy:    She has no cervical adenopathy.  Neurological: She displays normal reflexes. No cranial nerve  deficit. She exhibits normal muscle tone. Coordination normal.  Skin: No rash noted. No erythema.  Psychiatric: She has a normal mood and affect. Her behavior is normal. Judgment and thought content normal.          Assessment & Plan:

## 2010-09-17 NOTE — Assessment & Plan Note (Signed)
Doing well 

## 2010-09-18 ENCOUNTER — Encounter: Payer: Self-pay | Admitting: Internal Medicine

## 2010-10-01 ENCOUNTER — Encounter: Payer: Self-pay | Admitting: Internal Medicine

## 2010-10-02 ENCOUNTER — Telehealth: Payer: Self-pay | Admitting: Internal Medicine

## 2010-10-02 NOTE — Telephone Encounter (Signed)
Patient has been rescheduled for 10/09/10

## 2010-10-09 ENCOUNTER — Ambulatory Visit (INDEPENDENT_AMBULATORY_CARE_PROVIDER_SITE_OTHER): Payer: BC Managed Care – PPO | Admitting: Internal Medicine

## 2010-10-09 ENCOUNTER — Telehealth: Payer: Self-pay | Admitting: *Deleted

## 2010-10-09 ENCOUNTER — Encounter: Payer: Self-pay | Admitting: Internal Medicine

## 2010-10-09 VITALS — BP 112/72 | HR 80 | Ht 66.0 in | Wt 164.0 lb

## 2010-10-09 DIAGNOSIS — Z1211 Encounter for screening for malignant neoplasm of colon: Secondary | ICD-10-CM

## 2010-10-09 DIAGNOSIS — J019 Acute sinusitis, unspecified: Secondary | ICD-10-CM

## 2010-10-09 DIAGNOSIS — K219 Gastro-esophageal reflux disease without esophagitis: Secondary | ICD-10-CM

## 2010-10-09 DIAGNOSIS — Z9283 Personal history of failed moderate sedation: Secondary | ICD-10-CM

## 2010-10-09 MED ORDER — RANITIDINE HCL 150 MG PO TABS: ORAL_TABLET | ORAL | Status: DC

## 2010-10-09 MED ORDER — AZITHROMYCIN 250 MG PO TABS: ORAL_TABLET | ORAL | Status: AC

## 2010-10-09 NOTE — Telephone Encounter (Signed)
Rx sent in, left vm for pt to check w/her pharm

## 2010-10-09 NOTE — Telephone Encounter (Signed)
Pt c/o increase in sinus congestion with thick green mucus. She is req rx for antibiotic.

## 2010-10-09 NOTE — Assessment & Plan Note (Addendum)
She actually seems to be well treated for this. Her globus sensation could be related but I'm not convinced. GERD could be contributing to her sinus difficulties. I don't think endoscopy is indicated. She is going to continue the Prilosec and add Zantac at bedtime, using the over-the-counter medication 75-150. I will see her in 2 months to reassess.

## 2010-10-09 NOTE — Progress Notes (Signed)
  Subjective:    Patient ID: [name] [name], female    DOB: 1950/10/15, 60 y.o.   MRN: [id number]  HPI he is complaining of a flare of heartburn and reflux symptoms. She had done well for a number of years, having last seen me for this over 5 years ago. She had come off her PPI but over the past several months she had developed increasing heartburn and reflux. She went on Prilosec OTC daily with significant benefit but she called our office and was advised, while waiting for an appointment, to try that twice a day because of persistent complaints of dysphagia and what is really a globus sensation. She developed crampy abdominal pain and diarrhea with the twice daily PPI in lumen through a spell where she had nausea vomiting and loose stools recently, and after several days of twice a day PPI she stopped that.  She denies any real heartburn at this time on her Prilosec. She has a globus sensation. She was seen for sinusitis problems about 3 weeks ago by her PCP and was treated for allergic issues but continues with problems with nasal congestion and what sounds like sinus drainage. He has some mild headache problems with that, above the eyes bilaterally.  She claims she gets more than 3 hours before lying down after eating. She is reducing caffeine and using only one dose or 2 doses a day.  Other issues are that she's been quite busy helping her father after mother died from Clostridium difficile colitis early 2012. This is been quite stressful for her.    Review of Systems Positive for back pain, dyspnea, insomnia issues with disturbed sleep from symptoms. Otherwise all other review of systems negative.    Objective:   Physical Exam  Constitutional: She appears well-developed and well-nourished.  HENT:       Mild posterior pharyngeal cobblestoning Sounds congested  Eyes: Conjunctivae are normal. No scleral icterus.  Neck: Normal range of motion. Neck supple. No thyromegaly present.    Cardiovascular: Normal rate, regular rhythm and normal heart sounds.   Pulmonary/Chest: Effort normal and breath sounds normal.       Some coughing  Abdominal: Soft. Bowel sounds are normal. She exhibits no distension and no mass. There is no tenderness.  Musculoskeletal: She exhibits no edema.  Lymphadenopathy:    She has no cervical adenopathy.  Skin: Skin is warm and dry.  Psychiatric: She has a normal mood and affect.          Assessment & Plan:

## 2010-10-09 NOTE — Telephone Encounter (Signed)
Ok zpac OV if not better Thx

## 2010-10-09 NOTE — Patient Instructions (Signed)
Take Zantac 75 mg every night. Return in 2 months to see Dr. Leone Payor. With with your primary care physician about your sinus problems and other treatment.

## 2010-10-09 NOTE — Assessment & Plan Note (Signed)
She seems to continue to be struggling with this, the exact cause not clear. Question allergies question infection or both and admittedly reflux could be influencing this. She will ask Dr. Posey Rea if other treatment is indicated, specifically antibiotics.

## 2010-10-09 NOTE — Assessment & Plan Note (Signed)
Incomplete colonoscopy in 11/2005. Redundant colon and ? If sedation adequate - she says it was not. She is willing to attempt repeat optical colonoscopy with MAC - propofol sedation. Will discuss at follow-up in 2 months - does not want to schedule now.

## 2010-10-10 ENCOUNTER — Encounter: Payer: Self-pay | Admitting: Internal Medicine

## 2010-11-07 ENCOUNTER — Ambulatory Visit: Payer: BC Managed Care – PPO | Admitting: Internal Medicine

## 2010-11-19 ENCOUNTER — Other Ambulatory Visit: Payer: Self-pay | Admitting: Allergy and Immunology

## 2010-11-19 ENCOUNTER — Ambulatory Visit
Admission: RE | Admit: 2010-11-19 | Discharge: 2010-11-19 | Disposition: A | Discharge status: Home / Self Care | Payer: BC Managed Care – PPO | Source: Ambulatory Visit | Attending: Allergy and Immunology | Admitting: Allergy and Immunology

## 2010-11-19 DIAGNOSIS — R05 Cough: Secondary | ICD-10-CM

## 2010-11-19 DIAGNOSIS — R059 Cough, unspecified: Secondary | ICD-10-CM

## 2010-12-03 LAB — POCT I-STAT, CHEM 8
BUN: 13
Calcium, Ion: 1.19
Chloride: 106
Creatinine, Ser: 0.9
Glucose, Bld: 139 — ABNORMAL HIGH
HCT: 43
Hemoglobin: 14.6
Potassium: 4.1
Sodium: 140
TCO2: 25

## 2010-12-03 LAB — POCT CARDIAC MARKERS
CKMB, poc: <1 — ABNORMAL LOW
Myoglobin, poc: 34.6
Troponin i, poc: <0.05

## 2010-12-03 LAB — D-DIMER, QUANTITATIVE: D-Dimer, Quant: <0.22

## 2010-12-17 ENCOUNTER — Other Ambulatory Visit: Payer: Self-pay | Admitting: Allergy and Immunology

## 2010-12-17 ENCOUNTER — Ambulatory Visit
Admission: RE | Admit: 2010-12-17 | Discharge: 2010-12-17 | Disposition: A | Discharge status: Home / Self Care | Payer: BC Managed Care – PPO | Source: Ambulatory Visit | Attending: Allergy and Immunology | Admitting: Allergy and Immunology

## 2010-12-17 DIAGNOSIS — J329 Chronic sinusitis, unspecified: Secondary | ICD-10-CM

## 2010-12-31 ENCOUNTER — Ambulatory Visit: Payer: BC Managed Care – PPO | Admitting: Internal Medicine

## 2011-07-17 ENCOUNTER — Telehealth: Payer: Self-pay | Admitting: Internal Medicine

## 2011-07-17 NOTE — Telephone Encounter (Signed)
Left message for patient to call back  

## 2011-07-18 NOTE — Telephone Encounter (Signed)
Patient was to have 2 month follow up after office visit in 10/2010.  I have scheduled an office visit for her for 08/26/11 to discuss

## 2011-08-26 ENCOUNTER — Ambulatory Visit (INDEPENDENT_AMBULATORY_CARE_PROVIDER_SITE_OTHER): Payer: BC Managed Care – PPO | Admitting: Internal Medicine

## 2011-08-26 ENCOUNTER — Encounter: Payer: Self-pay | Admitting: Internal Medicine

## 2011-08-26 VITALS — BP 108/68 | HR 60 | Ht 66.0 in | Wt 170.4 lb

## 2011-08-26 DIAGNOSIS — Z1211 Encounter for screening for malignant neoplasm of colon: Secondary | ICD-10-CM

## 2011-08-26 DIAGNOSIS — K219 Gastro-esophageal reflux disease without esophagitis: Secondary | ICD-10-CM

## 2011-08-26 NOTE — Patient Instructions (Addendum)
Please let us know when you are ready to set up colonoscopy.

## 2011-08-26 NOTE — Progress Notes (Signed)
Patient ID: [name] [name], female   DOB: 03/30/1950, 61 y.o.   MRN: [id number]  The patient presents to discuss a repeat screening colonoscopy in a followup with GERD problems. She uses Prilosec 20 mg daily and notes that that controls her heartburn well. She thought the nightly ranitidine she had been using was causing some diarrhea so she stopped that and has not had nocturnal complaints of heartburn or reflux. She does note that she eliminated starchy foods and breads and sugary foods she could go without the Prilosec but has had a hard time maintaining that diet when eating out so went back to the Prilosec.  She previously had a colonoscopy in 2007, moderate sedation was not completely adequate, the colon was redundant and she had some pain in the left side and it was not completed. She has not schedule followup or repeat colonoscopy. We did discuss using propofol sedation when I saw her last year. She is closer to scheduling this but her brother is ill in on the respirator in the intensive care unit at [facility] she is not ready to do so yet. She is also asking about possible CT colonoscopy or other options at any rate and we discussed that as well. She tells me she has been doing annual Hemoccult testing through her gynecologist, Dr. Ambrose Mantle and that has been negative over the years. She has no active GI complaints at this time.   Allergies  Allergen Reactions  . Sulfonamide Derivatives    Outpatient Prescriptions Prior to Visit  Medication Sig Dispense Refill  . albuterol (PROAIR HFA) 108 (90 BASE) MCG/ACT inhaler Inhale 2 puffs into the lungs every 4 (four) hours as needed.        . Cholecalciferol (VITAMIN D3) 1000 UNITS tablet Take 1,000 Units by mouth daily.        . fish oil-omega-3 fatty acids 1000 MG capsule Take 2 g by mouth daily.        . fluticasone (FLONASE) 50 MCG/ACT nasal spray Place 2 sprays into the nose daily as needed.  48 g  3  . omeprazole (PRILOSEC OTC) 20 MG  tablet Take 20 mg by mouth daily.        Marland Kitchen loratadine (CLARITIN) 10 MG tablet Take 1 tablet (10 mg total) by mouth daily.  100 tablet  3  . Phenylephrine-APAP-Guaifenesin (MUCINEX FAST-MAX COLD & SINUS PO) Take 1 each by mouth as directed.        . pseudoephedrine (SUDAFED) 30 MG tablet Take 30 mg by mouth every 4 (four) hours as needed.        .         Past Medical History  Diagnosis Date  . Lumbago     and radiculopathy  . Ligament tear of lower extremity     L. foot Dr. Antony Odea  . GERD (gastroesophageal reflux disease)   . Allergic rhinitis   . Asthma   . Cataract     L. eye  . Bronchitis   . Diplopia   . Anxiety and depression   . Hypoglycemia   . Diverticulosis   . Sessile colonic polyp     4mm   Past Surgical History  Procedure Date  . Tubal ligation   . Shoulder surgery     impingement syndrome  . Upper gastrointestinal endoscopy 06/2002,     with dilation, esophageal stenosis  . Colonoscopy w/ polypectomy 11/2005    INCOMPLETE - to transverse colon  - diverticulosis, 4mm polyp  was hyperplastic  . Foot surgery     left - Dr. Lestine Box 2009  . Laparoscopic salpingoopherectomy 05/05/2006    serous cystadenofibroma   History   Social History  . Marital Status: Married    Spouse Name: N/A    Number of Children: 1  .     Occupational History  . office [employer]   Social History Main Topics  . Smoking status: Never Smoker   . Smokeless tobacco: Never Used  . Alcohol Use: No  . Drug Use: No  .       Family History  Problem Relation Age of Onset  . Heart disease Mother 50    CHF  . Multiple myeloma Mother   . Colon polyps Mother   . Diabetes Mother   . Breast cancer      cousin  . Lung cancer      uncle  . Colon cancer Neg Hx    Physical exam limited to general appearance and vital signs. He is well-developed well-nourished in no acute distress.   Assessment and plan:  1. GERD (gastroesophageal reflux disease)   Continue Prilosec 20 mg daily, work  on diet.   2. Special screening for malignant neoplasms, colon   Last colonoscopy 2007 with diminutive left-sided hyperplastic polyp but incomplete exam. Hemoccults are negative on an annual basis. I explained that that is in acceptable way to screen for colorectal cancer. She is considering CT versus optical colonoscopy and will let us know if she chooses to do one of those.    I appreciate the opportunity to care for this patient.  CC: Tracey Harries M.D.

## 2011-10-15 ENCOUNTER — Encounter: Payer: Self-pay | Admitting: Family Medicine

## 2011-10-15 ENCOUNTER — Telehealth: Payer: Self-pay | Admitting: Family Medicine

## 2011-10-15 ENCOUNTER — Ambulatory Visit (INDEPENDENT_AMBULATORY_CARE_PROVIDER_SITE_OTHER): Payer: BC Managed Care – PPO | Admitting: Family Medicine

## 2011-10-15 VITALS — BP 109/72 | HR 69 | Temp 96.8°F | Ht 66.0 in | Wt 171.5 lb

## 2011-10-15 DIAGNOSIS — Z1211 Encounter for screening for malignant neoplasm of colon: Secondary | ICD-10-CM

## 2011-10-15 DIAGNOSIS — G5600 Carpal tunnel syndrome, unspecified upper limb: Secondary | ICD-10-CM

## 2011-10-15 DIAGNOSIS — Z0000 Encounter for general adult medical examination without abnormal findings: Secondary | ICD-10-CM | POA: Insufficient documentation

## 2011-10-15 DIAGNOSIS — R635 Abnormal weight gain: Secondary | ICD-10-CM | POA: Insufficient documentation

## 2011-10-15 DIAGNOSIS — R7401 Elevation of levels of liver transaminase levels: Secondary | ICD-10-CM

## 2011-10-15 DIAGNOSIS — R7402 Elevation of levels of lactic acid dehydrogenase (LDH): Secondary | ICD-10-CM

## 2011-10-15 DIAGNOSIS — Z23 Encounter for immunization: Secondary | ICD-10-CM

## 2011-10-15 DIAGNOSIS — G4700 Insomnia, unspecified: Secondary | ICD-10-CM

## 2011-10-15 LAB — LIPID PANEL
Cholesterol: 186 mg/dL (ref 0–200)
HDL: 47.5 mg/dL (ref >39.00)
LDL Cholesterol: 124 mg/dL — ABNORMAL HIGH (ref 0–99)
Total CHOL/HDL Ratio: 4
Triglycerides: 72 mg/dL (ref 0.0–149.0)
VLDL: 14.4 mg/dL (ref 0.0–40.0)

## 2011-10-15 LAB — COMPREHENSIVE METABOLIC PANEL
ALT: 47 U/L — ABNORMAL HIGH (ref 0–35)
AST: 30 U/L (ref 0–37)
Albumin: 4.2 g/dL (ref 3.5–5.2)
Alkaline Phosphatase: 47 U/L (ref 39–117)
BUN: 16 mg/dL (ref 6–23)
CO2: 26 mEq/L (ref 19–32)
Calcium: 9.3 mg/dL (ref 8.4–10.5)
Chloride: 105 mEq/L (ref 96–112)
Creatinine, Ser: 0.5 mg/dL (ref 0.4–1.2)
GFR: 131.17 mL/min (ref >60.00)
Glucose, Bld: 94 mg/dL (ref 70–99)
Potassium: 4.1 mEq/L (ref 3.5–5.1)
Sodium: 139 mEq/L (ref 135–145)
Total Bilirubin: 0.5 mg/dL (ref 0.3–1.2)
Total Protein: 6.8 g/dL (ref 6.0–8.3)

## 2011-10-15 LAB — CBC WITH DIFFERENTIAL/PLATELET
Basophils Absolute: 0 10*3/uL (ref 0.0–0.1)
Basophils Relative: 0.4 % (ref 0.0–3.0)
Eosinophils Absolute: 0.2 10*3/uL (ref 0.0–0.7)
Eosinophils Relative: 5.1 % — ABNORMAL HIGH (ref 0.0–5.0)
HCT: 41 % (ref 36.0–46.0)
Hemoglobin: 13.5 g/dL (ref 12.0–15.0)
Lymphocytes Relative: 40.3 % (ref 12.0–46.0)
Lymphs Abs: 1.8 10*3/uL (ref 0.7–4.0)
MCHC: 32.8 g/dL (ref 30.0–36.0)
MCV: 96 fl (ref 78.0–100.0)
Monocytes Absolute: 0.3 10*3/uL (ref 0.1–1.0)
Monocytes Relative: 7.8 % (ref 3.0–12.0)
Neutro Abs: 2 10*3/uL (ref 1.4–7.7)
Neutrophils Relative %: 46.4 % (ref 43.0–77.0)
Platelets: 224 10*3/uL (ref 150.0–400.0)
RBC: 4.27 Mil/uL (ref 3.87–5.11)
RDW: 13 % (ref 11.5–14.6)
WBC: 4.4 10*3/uL — ABNORMAL LOW (ref 4.5–10.5)

## 2011-10-15 LAB — TSH: TSH: 2.25 u[IU]/mL (ref 0.35–5.50)

## 2011-10-15 NOTE — Progress Notes (Signed)
Office Note 10/17/2011  CC:  Chief Complaint  Patient presents with  . Annual Exam    Not sleeping well, worried about wt gain, 30 lb in last 4 years.  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     HPI:  [name] [name] is a 61 y.o. White female who is here as transfer from Dr. Posey Rea at [facility] on [address]. Old records in EPIC/HL reviewed prior to or during today's visit.  Pt c/o problems with wt gain over the last few years: at least 30 lbs in last 4 yrs. Gives hx of no specific dieting, reports exercise that is intermittent mostly.  Admits to periods of overeating and 'mindless eating'.    Describes poor sleep hs.  Initiates sleep decently, but wakes up several times a night with sweating mainly in her back, gets up to use the restroom and has trouble getting back to sleep.  Also says she wakes up a fair amount of the time with numbness in hands, takes minutes to go away.  Admits to sleeping with wrists cocked/flexed.     Past Medical History  Diagnosis Date  . Lumbago     and radiculopathy  . Ligament tear of lower extremity     L. foot Dr. Antony Odea  . GERD (gastroesophageal reflux disease)   . Allergic rhinitis   . Asthma   . Cataract     L. eye  . Diplopia   . Anxiety and depression   . Hypoglycemia   . Diverticulosis   . Sessile colonic polyp 2007    4mm  . Chronic sinusitis     CT sinuses 2012, moderately severe.    Past Surgical History  Procedure Date  . Tubal ligation   . Shoulder surgery     impingement syndrome  . Upper gastrointestinal endoscopy 06/2002,     with dilation, esophageal stenosis  . Colonoscopy w/ polypectomy 11/2005    INCOMPLETE - to transverse colon  - diverticulosis, 4mm polyp was hyperplastic  . Foot surgery     left - Dr. Lestine Box 2009  . Laparoscopic salpingoopherectomy 05/05/2006    serous cystadenofibroma    Family History  Problem Relation Age of Onset  . Heart disease Mother 38    CHF with valvular dz  . Multiple  myeloma Mother   . Colon polyps Mother   . Diabetes Mother   . Cancer Mother     multiple myel  . Breast cancer      cousin  . Lung cancer      uncle  . Colon cancer Neg Hx   . Heart disease Brother     one with aortic valve dz and one with CAD    History   Social History  . Marital Status: Married    Spouse Name: N/A    Number of Children: 1  . Years of Education: N/A   Occupational History  . office [employer]   Social History Main Topics  . Smoking status: Never Smoker   . Smokeless tobacco: Never Used  . Alcohol Use: No  . Drug Use: No  . Sexually Active: Not on file   Other Topics Concern  . 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  No drug use.Exercise is rare for her.  Takes care of her 13 y/o father.Diet: not limiting calories.  Outpatient Encounter Prescriptions as of 10/15/2011  Medication Sig Dispense Refill  . albuterol (PROAIR HFA) 108 (90 BASE) MCG/ACT inhaler Inhale 2 puffs into the lungs every 4 (four) hours as needed.        . calcium carbonate (OS-CAL) 600 MG TABS Take 600 mg by mouth daily.      . Cholecalciferol (VITAMIN D3) 1000 UNITS tablet Take 1,000 Units by mouth daily.        . fish oil-omega-3 fatty acids 1000 MG capsule Take 2 g by mouth daily.        . Melatonin 1 MG TABS Take 1 mg by mouth daily.      . Multiple Vitamin (MULTIVITAMIN) tablet Take 1 tablet by mouth daily.      Marland Kitchen omeprazole (PRILOSEC OTC) 20 MG tablet Take 20 mg by mouth daily.        . fluticasone (FLONASE) 50 MCG/ACT nasal spray Place 2 sprays into the nose daily as needed.  48 g  3  . meloxicam (MOBIC) 7.5 MG tablet       . montelukast (SINGULAIR) 10 MG tablet Take 10 mg by mouth at bedtime.        Allergies  Allergen Reactions  . Sulfonamide Derivatives     ROS Review of Systems  Constitutional: Negative for  fever, chills, appetite change and fatigue.  HENT: Negative for ear pain, congestion, sore throat, neck stiffness and dental problem.   Eyes: Negative for discharge, redness and visual disturbance.  Respiratory: Negative for cough, chest tightness, shortness of breath and wheezing.   Cardiovascular: Negative for chest pain, palpitations and leg swelling.  Gastrointestinal: Negative for nausea, vomiting, abdominal pain, diarrhea and blood in stool.  Genitourinary: Negative for dysuria, urgency, frequency, hematuria, flank pain and difficulty urinating.  Musculoskeletal: Negative for myalgias, back pain, joint swelling and arthralgias.  Skin: Negative for pallor and rash.  Neurological: Negative for dizziness, speech difficulty, weakness and headaches.  Hematological: Negative for adenopathy. Does not bruise/bleed easily.  Psychiatric/Behavioral: Negative for confusion and disturbed wake/sleep cycle. The patient is not nervous/anxious.     PE; Blood pressure 109/72, pulse 69, temperature 96.8 F (36 C), height 5\' 6"  (1.676 m), weight 171 lb 8 oz (77.792 kg), SpO2 98.00%. Gen: Alert, well appearing.  Patient is oriented to person, place, time, and situation. AFFECT: pleasant.  LUcid thought and speech. ENT: Ears: EACs clear, normal epithelium.  TMs with good light reflex and landmarks bilaterally.  Eyes: no injection, icteris, swelling, or exudate.  EOMI, PERRLA. Nose: no drainage or turbinate edema/swelling.  No injection or focal lesion.  Mouth: lips without lesion/swelling.  Oral mucosa pink and moist.  Dentition intact and without obvious caries or gingival swelling.  Oropharynx without erythema, exudate, or swelling.  Neck: supple/nontender.  No LAD, mass, or TM.  Carotid pulses 2+ bilaterally, without bruits. CV: RRR, no m/r/g.   LUNGS: CTA bilat, nonlabored resps, good aeration in all lung fields. ABD: soft, NT, ND, BS normal.  No hepatospenomegaly or mass.  No bruits. EXT: no clubbing,  cyanosis, or edema.  Modified phalen's testing was negative bilaterally.  No muscle wasting of hands.  No hypertrophy of wrists or finger joints.  Wrist ROM fully intact. Skin - no sores or suspicious lesions or rashes or color changes  Pertinent labs:  None today  ASSESSMENT AND PLAN:   Transfer patient:  Health maintenance examination Reviewed age and gender appropriate health maintenance issues (prudent diet, regular exercise, health risks of tobacco and excessive  alcohol, use of seatbelts, fire alarms in home, use of sunscreen).  Also reviewed age and gender appropriate health screening as well as vaccine recommendations.  She'll be due for Pap and mammogram through her GYN soon. Tdap given today. iFob given for colon cancer screening (she is currently trying to decide on how to proceed as far as her next procedural check for this problem--see recent GI notes.  Carpal tunnel syndrome Her symptoms fit this, but exam today normal. I recommended a trial of night-time wrist splinting bilaterally and see if this helps decrease the incidence of her sx's and thereby improve her sleep.  I dispensed/fitted her with bilateral wrist splints today in the office.  Insomnia Maintenance insomnia, mainly associated with waking up with "hot feeling back" and also sometimes numbness in hands. She is somewhat averse to the idea of medications and would like to avoid them if at all possible. I don't think hormonal hot flashes are the cause, but I did suggest she try some OTC black cohosh for a while. I also reiterated the importance of sleep hygiene and we reviewed good/normal sleep hygiene.  Weight gain, abnormal We reviewed some basics of healthy lifestyle, emphasizing eating instinctively (only when hungry, stopping the instant she feels full), finding something enjoyable to do for exercise and not putting too much pressure on herself to lose wt rapidly.  Avoid "yo yo" dieting.  Check routine fasting  health panel today.  Return in about 3 months (around 01/15/2012) for f/u wt issues, CTS, sleep issues.

## 2011-10-15 NOTE — Patient Instructions (Addendum)
Buy over the [employer]. Wear your wrist splints every night.Health Maintenance, Females A healthy lifestyle and preventative care can promote health and wellness.  Maintain regular health, dental, and eye exams.   Eat a healthy diet. Foods like vegetables, fruits, whole grains, low-fat dairy products, and lean protein foods contain the nutrients you need without too many calories. Decrease your intake of foods high in solid fats, added sugars, and salt. Get information about a proper diet from your caregiver, if necessary.   Regular physical exercise is one of the most important things you can do for your health. Most adults should get at least 150 minutes of moderate-intensity exercise (any activity that increases your heart rate and causes you to sweat) each week. In addition, most adults need muscle-strengthening exercises on 2 or more days a week.    Maintain a healthy weight. The body mass index (BMI) is a screening tool to identify possible weight problems. It provides an estimate of body fat based on height and weight. Your caregiver can help determine your BMI, and can help you achieve or maintain a healthy weight. For adults 20 years and older:   A BMI below 18.5 is considered underweight.   A BMI of 18.5 to 24.9 is normal.   A BMI of 25 to 29.9 is considered overweight.   A BMI of 30 and above is considered obese.   Maintain normal blood lipids and cholesterol by exercising and minimizing your intake of saturated fat. Eat a balanced diet with plenty of fruits and vegetables. Blood tests for lipids and cholesterol should begin at age 71 and be repeated every 5 years. If your lipid or cholesterol levels are high, you are over 50, or you are a high risk for heart disease, you may need your cholesterol levels checked more frequently.Ongoing high lipid and cholesterol levels should be treated with medicines if diet and exercise are not effective.   If you smoke, find out from  your caregiver how to quit. If you do not use tobacco, do not start.   If you are pregnant, do not drink alcohol. If you are breastfeeding, be very cautious about drinking alcohol. If you are not pregnant and choose to drink alcohol, do not exceed 1 drink per day. One drink is considered to be 12 ounces (355 mL) of beer, 5 ounces (148 mL) of wine, or 1.5 ounces (44 mL) of liquor.   Avoid use of street drugs. Do not share needles with anyone. Ask for help if you need support or instructions about stopping the use of drugs.   High blood pressure causes heart disease and increases the risk of stroke. Blood pressure should be checked at least every 1 to 2 years. Ongoing high blood pressure should be treated with medicines, if weight loss and exercise are not effective.   If you are 33 to 59 years old, ask your caregiver if you should take aspirin to prevent strokes.   Diabetes screening involves taking a blood sample to check your fasting blood sugar level. This should be done once every 3 years, after age 36, if you are within normal weight and without risk factors for diabetes. Testing should be considered at a younger age or be carried out more frequently if you are overweight and have at least 1 risk factor for diabetes.   Breast cancer screening is essential preventative care for women. You should practice "breast self-awareness." This means understanding the normal appearance and feel of your breasts  and may include breast self-examination. Any changes detected, no matter how small, should be reported to a caregiver. Women in their 44s and 30s should have a clinical breast exam (CBE) by a caregiver as part of a regular health exam every 1 to 3 years. After age 65, women should have a CBE every year. Starting at age 5, women should consider having a mammogram (breast X-ray) every year. Women who have a family history of breast cancer should talk to their caregiver about genetic screening. Women at a  high risk of breast cancer should talk to their caregiver about having an MRI and a mammogram every year.   The Pap test is a screening test for cervical cancer. Women should have a Pap test starting at age 67. Between ages 61 and 49, Pap tests should be repeated every 2 years. Beginning at age 53, you should have a Pap test every 3 years as long as the past 3 Pap tests have been normal. If you had a hysterectomy for a problem that was not cancer or a condition that could lead to cancer, then you no longer need Pap tests. If you are between ages 81 and 56, and you have had normal Pap tests going back 10 years, you no longer need Pap tests. If you have had past treatment for cervical cancer or a condition that could lead to cancer, you need Pap tests and screening for cancer for at least 20 years after your treatment. If Pap tests have been discontinued, risk factors (such as a new sexual partner) need to be reassessed to determine if screening should be resumed. Some women have medical problems that increase the chance of getting cervical cancer. In these cases, your caregiver may recommend more frequent screening and Pap tests.   The human papillomavirus (HPV) test is an additional test that may be used for cervical cancer screening. The HPV test looks for the virus that can cause the cell changes on the cervix. The cells collected during the Pap test can be tested for HPV. The HPV test could be used to screen women aged 68 years and older, and should be used in women of any age who have unclear Pap test results. After the age of 63, women should have HPV testing at the same frequency as a Pap test.   Colorectal cancer can be detected and often prevented. Most routine colorectal cancer screening begins at the age of 32 and continues through age 63. However, your caregiver may recommend screening at an earlier age if you have risk factors for colon cancer. On a yearly basis, your caregiver may provide home  test kits to check for hidden blood in the stool. Use of a small camera at the end of a tube, to directly examine the colon (sigmoidoscopy or colonoscopy), can detect the earliest forms of colorectal cancer. Talk to your caregiver about this at age 71, when routine screening begins. Direct examination of the colon should be repeated every 5 to 10 years through age 30, unless early forms of pre-cancerous polyps or small growths are found.   Hepatitis C blood testing is recommended for all people born from [birth year] through 1963 and any individual with known risks for hepatitis C.   Practice safe sex. Use condoms and avoid high-risk sexual practices to reduce the spread of sexually transmitted infections (STIs). Sexually active women aged 77 and younger should be checked for Chlamydia, which is a common sexually transmitted infection. Older women with new or  multiple partners should also be tested for Chlamydia. Testing for other STIs is recommended if you are sexually active and at increased risk.   Osteoporosis is a disease in which the bones lose minerals and strength with aging. This can result in serious bone fractures. The risk of osteoporosis can be identified using a bone density scan. Women ages 17 and over and women at risk for fractures or osteoporosis should discuss screening with their caregivers. Ask your caregiver whether you should be taking a calcium supplement or vitamin D to reduce the rate of osteoporosis.   Menopause can be associated with physical symptoms and risks. Hormone replacement therapy is available to decrease symptoms and risks. You should talk to your caregiver about whether hormone replacement therapy is right for you.   Use sunscreen with a sun protection factor (SPF) of 30 or greater. Apply sunscreen liberally and repeatedly throughout the day. You should seek shade when your shadow is shorter than you. Protect yourself by wearing long sleeves, pants, a wide-brimmed hat, and  sunglasses year round, whenever you are outdoors.   Notify your caregiver of new moles or changes in moles, especially if there is a change in shape or color. Also notify your caregiver if a mole is larger than the size of a pencil eraser.   Stay current with your immunizations.  Document Released: 09/02/2010 Document Revised: 02/06/2011 Document Reviewed: 09/02/2010 [employer] Patient Information 2012 [place], [place].

## 2011-10-15 NOTE — Assessment & Plan Note (Addendum)
Reviewed age and gender appropriate health maintenance issues (prudent diet, regular exercise, health risks of tobacco and excessive alcohol, use of seatbelts, fire alarms in home, use of sunscreen).  Also reviewed age and gender appropriate health screening as well as vaccine recommendations.  She'll be due for Pap and mammogram through her GYN soon. Tdap given today. iFob given for colon cancer screening (she is currently trying to decide on how to proceed as far as her next procedural check for this problem--see recent GI notes.

## 2011-10-15 NOTE — Assessment & Plan Note (Signed)
Maintenance insomnia, mainly associated with waking up with "hot feeling back" and also sometimes numbness in hands. She is somewhat averse to the idea of medications and would like to avoid them if at all possible. I don't think hormonal hot flashes are the cause, but I did suggest she try some OTC black cohosh for a while. I also reiterated the importance of sleep hygiene and we reviewed good/normal sleep hygiene.

## 2011-10-15 NOTE — Assessment & Plan Note (Addendum)
Her symptoms fit this, but exam today normal. I recommended a trial of night-time wrist splinting bilaterally and see if this helps decrease the incidence of her sx's and thereby improve her sleep.  I dispensed/fitted her with bilateral wrist splints today in the office.

## 2011-10-15 NOTE — Telephone Encounter (Signed)
Patient is requesting a phone call when her lab results come in. She is not using MyChart yet

## 2011-10-15 NOTE — Telephone Encounter (Signed)
We call all patients with lab results, normal or not.  That is our protocol.

## 2011-10-16 NOTE — Progress Notes (Signed)
Quick Note:  Patient Informed and voiced understanding.  Appt scheduled for 11-28-11 ______

## 2011-10-17 NOTE — Assessment & Plan Note (Signed)
We reviewed some basics of healthy lifestyle, emphasizing eating instinctively (only when hungry, stopping the instant she feels full), finding something enjoyable to do for exercise and not putting too much pressure on herself to lose wt rapidly.  Avoid "yo yo" dieting.

## 2011-10-20 NOTE — Telephone Encounter (Signed)
Labs mailed to pt per pts request 

## 2011-10-20 NOTE — Telephone Encounter (Signed)
Please mail recent lab results to patient.

## 2011-10-31 ENCOUNTER — Other Ambulatory Visit: Payer: Self-pay | Admitting: Family Medicine

## 2011-11-01 MED ORDER — FLUTICASONE PROPIONATE 50 MCG/ACT NA SUSP: 2.0000 | Freq: Every day | NASAL | Status: DC | PRN

## 2011-11-01 NOTE — Telephone Encounter (Signed)
Refill request for FLONASE Last filled- 09/17/10, #48 X 3 Last seen- 10/15/11 Follow up - 3 MONTHS, 01/15/12 RX SENT

## 2011-11-18 ENCOUNTER — Other Ambulatory Visit: Payer: BC Managed Care – PPO

## 2011-11-18 DIAGNOSIS — Z1211 Encounter for screening for malignant neoplasm of colon: Secondary | ICD-10-CM

## 2011-11-18 DIAGNOSIS — Z0000 Encounter for general adult medical examination without abnormal findings: Secondary | ICD-10-CM

## 2011-11-18 LAB — FECAL OCCULT BLOOD, IMMUNOCHEMICAL: Fecal Occult Bld: NEGATIVE

## 2011-11-28 ENCOUNTER — Encounter: Payer: Self-pay | Admitting: Family Medicine

## 2011-11-28 ENCOUNTER — Other Ambulatory Visit (INDEPENDENT_AMBULATORY_CARE_PROVIDER_SITE_OTHER): Payer: BC Managed Care – PPO

## 2011-11-28 DIAGNOSIS — R7401 Elevation of levels of liver transaminase levels: Secondary | ICD-10-CM

## 2011-11-28 DIAGNOSIS — R7402 Elevation of levels of lactic acid dehydrogenase (LDH): Secondary | ICD-10-CM

## 2011-11-28 LAB — HEPATIC FUNCTION PANEL
ALT: 48 U/L — ABNORMAL HIGH (ref 0–35)
AST: 27 U/L (ref 0–37)
Albumin: 4 g/dL (ref 3.5–5.2)
Alkaline Phosphatase: 52 U/L (ref 39–117)
Bilirubin, Direct: 0.1 mg/dL (ref 0.0–0.3)
Total Bilirubin: 0.6 mg/dL (ref 0.3–1.2)
Total Protein: 6.7 g/dL (ref 6.0–8.3)

## 2011-12-04 ENCOUNTER — Other Ambulatory Visit: Payer: Self-pay | Admitting: Family Medicine

## 2011-12-04 DIAGNOSIS — R7401 Elevation of levels of liver transaminase levels: Secondary | ICD-10-CM

## 2011-12-04 NOTE — Progress Notes (Signed)
OK.  U/s ordered ([facility]).-thx

## 2011-12-09 ENCOUNTER — Ambulatory Visit (HOSPITAL_BASED_OUTPATIENT_CLINIC_OR_DEPARTMENT_OTHER)
Admission: RE | Admit: 2011-12-09 | Discharge: 2011-12-09 | Disposition: A | Discharge status: Home / Self Care | Payer: BC Managed Care – PPO | Source: Ambulatory Visit | Attending: Family Medicine | Admitting: Family Medicine

## 2011-12-09 ENCOUNTER — Other Ambulatory Visit: Payer: Self-pay | Admitting: Family Medicine

## 2011-12-09 ENCOUNTER — Other Ambulatory Visit: Payer: BC Managed Care – PPO

## 2011-12-09 DIAGNOSIS — R945 Abnormal results of liver function studies: Secondary | ICD-10-CM | POA: Insufficient documentation

## 2011-12-09 DIAGNOSIS — R7401 Elevation of levels of liver transaminase levels: Secondary | ICD-10-CM

## 2011-12-10 LAB — HEPATITIS C ANTIBODY: HCV Ab: NEGATIVE

## 2011-12-10 LAB — HEPATITIS B SURFACE ANTIGEN: Hepatitis B Surface Ag: NEGATIVE

## 2012-01-14 DIAGNOSIS — Z0279 Encounter for issue of other medical certificate: Secondary | ICD-10-CM

## 2012-01-15 ENCOUNTER — Ambulatory Visit: Payer: BC Managed Care – PPO | Admitting: Family Medicine

## 2012-02-02 ENCOUNTER — Encounter: Payer: Self-pay | Admitting: Family Medicine

## 2012-02-02 ENCOUNTER — Ambulatory Visit (INDEPENDENT_AMBULATORY_CARE_PROVIDER_SITE_OTHER): Payer: BC Managed Care – PPO | Admitting: Family Medicine

## 2012-02-02 VITALS — BP 105/69 | HR 83 | Temp 98.8°F | Wt 174.0 lb

## 2012-02-02 DIAGNOSIS — R7402 Elevation of levels of lactic acid dehydrogenase (LDH): Secondary | ICD-10-CM

## 2012-02-02 DIAGNOSIS — R7401 Elevation of levels of liver transaminase levels: Secondary | ICD-10-CM

## 2012-02-02 NOTE — Progress Notes (Signed)
OFFICE NOTE  02/02/2012  CC:  Chief Complaint  Patient presents with  . Results    discuss recent labs     HPI: Patient is a 61 y.o. Caucasian female who is here for discussion of recent mildly elevated ALT (8/13 and repeat 9/13).  Her viral hepatitis screens and her abd u/s were normal.  The remainder of her liver panel, CBC, and lipids are all normal.  Alcohol overingestion and tylenol/liver toxic meds are not an issue with her. She denies any abd pain, fatigue, nausea, skin color changes, or swelling.  Pertinent PMH:  Past Medical History  Diagnosis Date  . Lumbago     and radiculopathy  . Ligament tear of lower extremity     L. foot Dr. Antony Odea  . GERD (gastroesophageal reflux disease)   . Allergic rhinitis   . Asthma   . Cataract     L. eye  . Diplopia   . Anxiety and depression   . Hypoglycemia   . Diverticulosis   . Sessile colonic polyp 2007    4mm  . Chronic sinusitis     CT sinuses 2012, moderately severe.    MEDS:  Outpatient Prescriptions Prior to Visit  Medication Sig Dispense Refill  . albuterol (PROAIR HFA) 108 (90 BASE) MCG/ACT inhaler Inhale 2 puffs into the lungs every 4 (four) hours as needed.        . calcium carbonate (OS-CAL) 600 MG TABS Take 600 mg by mouth daily.      . Cholecalciferol (VITAMIN D3) 1000 UNITS tablet Take 1,000 Units by mouth daily.        . fish oil-omega-3 fatty acids 1000 MG capsule Take 2 g by mouth daily.        . fluticasone (FLONASE) 50 MCG/ACT nasal spray Place 2 sprays into the nose daily as needed.  48 g  3  . Multiple Vitamin (MULTIVITAMIN) tablet Take 1 tablet by mouth daily.      Marland Kitchen omeprazole (PRILOSEC OTC) 20 MG tablet Take 20 mg by mouth daily.        . montelukast (SINGULAIR) 10 MG tablet Take 10 mg by mouth at bedtime.      . [DISCONTINUED] Melatonin 1 MG TABS Take 1 mg by mouth daily.      . [DISCONTINUED] meloxicam (MOBIC) 7.5 MG tablet        Last reviewed on 02/02/2012  3:06 PM by Luisa Dago,  CMA  PE: Blood pressure 105/69, pulse 83, temperature 98.8 F (37.1 C), temperature source Temporal, weight 174 lb (78.926 kg). Gen: Alert, well appearing.  Patient is oriented to person, place, time, and situation. AFFECT: pleasant, lucid thought and speech. No further exam today.  IMPRESSION AND PLAN:  Elevated ALT measurement Reassured pt today, answered questions. ALT very mildly elevated but stable on two measurements about a month apart. Preliminary w/u neg. Will repeat hepatic panel in 45mo. Discussed signs and symptoms of liver disease/what to call or return for.   An After Visit Summary was printed and given to the patient.   FOLLOW UP: lab visit 6 mo

## 2012-02-08 DIAGNOSIS — R7401 Elevation of levels of liver transaminase levels: Secondary | ICD-10-CM | POA: Insufficient documentation

## 2012-02-08 NOTE — Assessment & Plan Note (Signed)
Reassured pt today, answered questions. ALT very mildly elevated but stable on two measurements about a month apart. Preliminary w/u neg. Will repeat hepatic panel in 38mo. Discussed signs and symptoms of liver disease/what to call or return for.

## 2012-04-17 ENCOUNTER — Other Ambulatory Visit: Payer: Self-pay

## 2012-09-01 ENCOUNTER — Other Ambulatory Visit: Payer: Self-pay | Admitting: Family Medicine

## 2012-09-01 MED ORDER — FLUTICASONE PROPIONATE 50 MCG/ACT NA SUSP: 2.0000 | Freq: Every day | NASAL | Status: DC | PRN

## 2012-09-14 ENCOUNTER — Encounter: Payer: Self-pay | Admitting: Family Medicine

## 2012-11-19 ENCOUNTER — Encounter: Payer: Self-pay | Admitting: Family Medicine

## 2012-11-19 ENCOUNTER — Ambulatory Visit (INDEPENDENT_AMBULATORY_CARE_PROVIDER_SITE_OTHER): Payer: BC Managed Care – PPO | Admitting: Family Medicine

## 2012-11-19 VITALS — BP 116/72 | HR 84 | Temp 98.2°F | Resp 16 | Ht 66.0 in | Wt 176.0 lb

## 2012-11-19 DIAGNOSIS — G4700 Insomnia, unspecified: Secondary | ICD-10-CM

## 2012-11-19 MED ORDER — TEMAZEPAM 15 MG PO CAPS: ORAL_CAPSULE | ORAL | Status: DC

## 2012-11-19 NOTE — Progress Notes (Signed)
OFFICE NOTE  11/19/2012  CC:  Chief Complaint  Patient presents with  . Insomnia     HPI: Patient is a 62 y.o. Caucasian female who is here for insomnia. About 2 mo hx of "mind not shutting off" and can't sleep.  No change in nighttime routine, no RLS sx's.  Lays there 1-2 hours before falling asleep.  Sometimes reads in bed and this used to help her get to sleep but now it doesn't.  Has tried melatonin and she feels like it actually wired her. She has cut out caffeine and sugar late in day.  She also has multiple awakenings after falling asleep--to go to the bathroom or b/c she's hot or because she is hurting in shoulders. Husband says she has some intermittent jerks in her sleep.   Tried oxycodone x 1 and robaxin x 1---no help.  Tramadol no help.  Denies depression or anxiety currently.  She is not sleepy during the day, either.   Pertinent PMH:  Past Medical History  Diagnosis Date  . Lumbago     and radiculopathy  . Ligament tear of lower extremity     L. foot Dr. Antony Odea  . GERD (gastroesophageal reflux disease)   . Allergic rhinitis   . Asthma   . Cataract     L. eye  . Diplopia   . Anxiety and depression   . Hypoglycemia   . Diverticulosis   . Sessile colonic polyp 2007    4mm  . Chronic sinusitis     CT sinuses 2012, moderately severe.   Past Surgical History  Procedure Laterality Date  . Tubal ligation    . Shoulder surgery      impingement syndrome  . Upper gastrointestinal endoscopy  06/2002,     with dilation, esophageal stenosis  . Colonoscopy w/ polypectomy  11/2005    INCOMPLETE - to transverse colon  - diverticulosis, 4mm polyp was hyperplastic  . Foot surgery      left - Dr. Lestine Box 2009  . Laparoscopic salpingoopherectomy  05/05/2006    serous cystadenofibroma   History   Social History Narrative   Married, 1 biolog daughter, 3 step children all grown.   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   Orig from [place] of [place].   Never smoker, no alcohol.  No drug use.   Exercise is rare for her.  Takes care of her 72 y/o father.   Diet: not limiting calories.    MEDS:  Outpatient Prescriptions Prior to Visit  Medication Sig Dispense Refill  . albuterol (PROAIR HFA) 108 (90 BASE) MCG/ACT inhaler Inhale 2 puffs into the lungs every 4 (four) hours as needed.        . calcium carbonate (OS-CAL) 600 MG TABS Take 600 mg by mouth daily.      . Cholecalciferol (VITAMIN D3) 1000 UNITS tablet Take 1,000 Units by mouth daily.        . fish oil-omega-3 fatty acids 1000 MG capsule Take 2 g by mouth daily.        . fluticasone (FLONASE) 50 MCG/ACT nasal spray Place 2 sprays into the nose daily as needed.  48 g  0  . Multiple Vitamin (MULTIVITAMIN) tablet Take 1 tablet by mouth daily.      Marland Kitchen omeprazole (PRILOSEC OTC) 20 MG tablet Take 20 mg by mouth daily.        . montelukast (SINGULAIR) 10 MG tablet Take 10 mg by mouth at bedtime.  No facility-administered medications prior to visit.    PE: Blood pressure 116/72, pulse 84, temperature 98.2 F (36.8 C), temperature source Temporal, resp. rate 16, height 5\' 6"  (1.676 m), weight 176 lb (79.833 kg), SpO2 97.00%. Gen: Alert, well appearing.  Patient is oriented to person, place, time, and situation. ENT: Ears: EACs clear, normal epithelium.  TMs with good light reflex and landmarks bilaterally.  Eyes: no injection, icteris, swelling, or exudate.  EOMI, PERRLA. Nose: no drainage or turbinate edema/swelling.  No injection or focal lesion.  Mouth: lips without lesion/swelling.  Oral mucosa pink and moist.  Dentition intact and without obvious caries or gingival swelling.  Oropharynx without erythema, exudate, or swelling.  CV: RRR, no m/r/g.   LUNGS: CTA bilat, nonlabored resps, good aeration in all lung fields. Neuro: CN 2-12 intact bilaterally, strength 5/5 in proximal and distal upper extremities and lower extremities bilaterally.  No sensory deficits.  No tremor.  No  disdiadochokinesis.  No ataxia.  Upper extremity and lower extremity DTRs symmetric.  No pronator drift.   IMPRESSION AND PLAN:  Insomnia:  No clear reason revealed by history of physical. Discussed sleep hygiene (which she seems to be doing pretty good with). Trial of restoril generic 15mg , 1-2 qhs prn, #10, no RF. She'll call with report of how she does on this.  FOLLOW UP: prn

## 2013-01-06 ENCOUNTER — Other Ambulatory Visit: Payer: Self-pay

## 2013-06-02 ENCOUNTER — Ambulatory Visit (INDEPENDENT_AMBULATORY_CARE_PROVIDER_SITE_OTHER): Payer: 59 | Admitting: Family Medicine

## 2013-06-02 ENCOUNTER — Encounter: Payer: Self-pay | Admitting: Family Medicine

## 2013-06-02 VITALS — BP 106/76 | HR 75 | Temp 99.8°F | Resp 18 | Ht 66.0 in | Wt 176.0 lb

## 2013-06-02 DIAGNOSIS — J209 Acute bronchitis, unspecified: Secondary | ICD-10-CM

## 2013-06-02 DIAGNOSIS — J019 Acute sinusitis, unspecified: Secondary | ICD-10-CM

## 2013-06-02 DIAGNOSIS — J309 Allergic rhinitis, unspecified: Secondary | ICD-10-CM

## 2013-06-02 MED ORDER — AMOXICILLIN-POT CLAVULANATE 875-125 MG PO TABS: 1.0000 | ORAL_TABLET | Freq: Two times a day (BID) | ORAL | Status: DC

## 2013-06-02 MED ORDER — FLUTICASONE PROPIONATE 50 MCG/ACT NA SUSP
2.0000 | Freq: Every day | NASAL | Status: DC | PRN
Start: 2013-06-02 — End: 2014-05-16

## 2013-06-02 MED ORDER — METHYLPREDNISOLONE ACETATE 40 MG/ML IJ SUSP
40.0000 mg | Freq: Once | INTRAMUSCULAR | Status: AC
  Administered 2013-06-02: 40 mg via INTRA_ARTICULAR

## 2013-06-02 NOTE — Progress Notes (Signed)
OFFICE NOTE  06/02/2013  CC:  Chief Complaint  Patient presents with  . Nasal Congestion  . Cough  . Ear Fullness  . Headache     HPI: Patient is a 63 y.o. Caucasian female who is here for URI sx's. Several weeks of nasal congestion, sinus fullness, ears full, +ST, frontal HA, PND, coughing.  No wheezing.  No fever.  Took 10d of husband's keflex and felt a bit improved but then all sx's returned.  ROS: recent "stomach flu" last 3d or so  Pertinent PMH:  Past medical, surgical, social, and family history reviewed and no changes are noted since last office visit.  MEDS: Not taking singulair. Taking OTC zyrtec qhs Outpatient Prescriptions Prior to Visit  Medication Sig Dispense Refill  . albuterol (PROAIR HFA) 108 (90 BASE) MCG/ACT inhaler Inhale 2 puffs into the lungs every 4 (four) hours as needed.        . calcium carbonate (OS-CAL) 600 MG TABS Take 600 mg by mouth daily.      . Cholecalciferol (VITAMIN D3) 1000 UNITS tablet Take 1,000 Units by mouth daily.        . fish oil-omega-3 fatty acids 1000 MG capsule Take 2 g by mouth daily.        . fluticasone (FLONASE) 50 MCG/ACT nasal spray Place 2 sprays into the nose daily as needed.  48 g  0  . Multiple Vitamin (MULTIVITAMIN) tablet Take 1 tablet by mouth daily.      . temazepam (RESTORIL) 15 MG capsule 1-2 tabs po qhs prn insomnia  10 capsule  0  . montelukast (SINGULAIR) 10 MG tablet Take 10 mg by mouth at bedtime.      Marland Kitchen omeprazole (PRILOSEC OTC) 20 MG tablet Take 20 mg by mouth daily.         No facility-administered medications prior to visit.    PE: Blood pressure 106/76, pulse 75, temperature 99.8 F (37.7 C), temperature source Temporal, resp. rate 18, height 5\' 6"  (1.676 m), weight 176 lb (79.833 kg), SpO2 97.00%. VS: noted--normal. Gen: alert, NAD, NONTOXIC APPEARING. HEENT: eyes without injection, drainage, or swelling.  Ears: EACs clear, TMs with normal light reflex and landmarks.  Nose: Clear rhinorrhea, with  some dried, crusty exudate adherent to mildly injected mucosa.  No purulent d/c.  No paranasal sinus TTP.  No facial swelling.  Throat and mouth without focal lesion.  No pharyngial swelling, erythema, or exudate.   Neck: supple, no LAD.   LUNGS: CTA bilat, nonlabored resps.   CV: RRR, no m/r/g. EXT: no c/c/e SKIN: no rash   IMPRESSION AND PLAN:  Allergic rhinitis. Acute sinusitis. Acute bronchitis.  Plan: Depo medrol 40mg  IM. Augmentin 875mg  bid x 10d. Trial of mucinex DM or robitussin DM otc as directed on the box. May use OTC nasal saline spray or irrigation solution bid. OTC nonsedating antihistamines prn discussed.  Decongestant use discussed--ok if tolerated in the past w/out side effect and if pt has no hx of HTN.  An After Visit Summary was printed and given to the patient.  FOLLOW UP: prn

## 2013-06-02 NOTE — Progress Notes (Signed)
Pre visit review using our clinic review tool, if applicable. No additional management support is needed unless otherwise documented below in the visit note. 

## 2014-01-09 ENCOUNTER — Telehealth: Payer: Self-pay | Admitting: Family Medicine

## 2014-01-09 NOTE — Telephone Encounter (Signed)
Lowella Curb called. Her FMLA paperwork needs resubmitted. Does Dr. Anitra Lauth need to see [name] [name] for him to complete the new FMLA paperwork? Also she would like to talk to someone about her dads new needs. [phone] between 8-4:30p./DH

## 2014-01-19 DIAGNOSIS — Z0279 Encounter for issue of other medical certificate: Secondary | ICD-10-CM

## 2014-05-16 ENCOUNTER — Ambulatory Visit (INDEPENDENT_AMBULATORY_CARE_PROVIDER_SITE_OTHER): Payer: 59 | Admitting: Family Medicine

## 2014-05-16 ENCOUNTER — Encounter: Payer: Self-pay | Admitting: Family Medicine

## 2014-05-16 VITALS — BP 110/70 | HR 86 | Temp 98.4°F | Ht 66.0 in | Wt 159.0 lb

## 2014-05-16 DIAGNOSIS — J4520 Mild intermittent asthma, uncomplicated: Secondary | ICD-10-CM

## 2014-05-16 DIAGNOSIS — J208 Acute bronchitis due to other specified organisms: Secondary | ICD-10-CM

## 2014-05-16 MED ORDER — PREDNISONE 20 MG PO TABS: ORAL_TABLET | ORAL | Status: DC

## 2014-05-16 MED ORDER — HYDROCODONE-HOMATROPINE 5-1.5 MG/5ML PO SYRP: ORAL_SOLUTION | ORAL | Status: DC

## 2014-05-16 NOTE — Patient Instructions (Signed)
Take OTC mucinex DM as directed on packaging for your cough.

## 2014-05-16 NOTE — Progress Notes (Signed)
Pre visit review using our clinic review tool, if applicable. No additional management support is needed unless otherwise documented below in the visit note. 

## 2014-05-16 NOTE — Progress Notes (Signed)
OFFICE NOTE  05/16/2014  CC:  Chief Complaint  Patient presents with  . Cough   HPI: Patient is a 64 y.o. Caucasian female who is here for cough.  Onset of some throat discomfort/PND 3 days ago, followed by cough that has progressively worsened.  Some HA and R>L ear pain.  No fever. Feels SOB, wheezing, chest tightness.  No pain in face or upper teeth.  No signif nasal congestion, no nasal mucous, no sneezing.  Trying mucinex plain, cough drops.   Pertinent PMH:  Past medical, surgical, social, and family history reviewed and no changes are noted since last office visit. +Mild intermittent asthma: never smoker.  MEDS: Not taking amoxil listed below Outpatient Prescriptions Prior to Visit  Medication Sig Dispense Refill  . albuterol (PROAIR HFA) 108 (90 BASE) MCG/ACT inhaler Inhale 2 puffs into the lungs every 4 (four) hours as needed.      . calcium carbonate (OS-CAL) 600 MG TABS Take 600 mg by mouth daily.    . Cholecalciferol (VITAMIN D3) 1000 UNITS tablet Take 1,000 Units by mouth daily.      . fish oil-omega-3 fatty acids 1000 MG capsule Take 2 g by mouth daily.      . Multiple Vitamin (MULTIVITAMIN) tablet Take 1 tablet by mouth daily.    . temazepam (RESTORIL) 15 MG capsule 1-2 tabs po qhs prn insomnia (Patient not taking: Reported on 05/16/2014) 10 capsule 0  . amoxicillin-clavulanate (AUGMENTIN) 875-125 MG per tablet Take 1 tablet by mouth 2 (two) times daily. (Patient not taking: Reported on 05/16/2014) 20 tablet 0  . cetirizine (ZYRTEC) 10 MG tablet Take 10 mg by mouth daily.    . fluticasone (FLONASE) 50 MCG/ACT nasal spray Place 2 sprays into both nostrils daily as needed. (Patient not taking: Reported on 05/16/2014) 48 g 4  . omeprazole (PRILOSEC OTC) 20 MG tablet Take 20 mg by mouth daily.       No facility-administered medications prior to visit.    PE: Blood pressure 110/70, pulse 86, temperature 98.4 F (36.9 C), temperature source Oral, height 5\' 6"  (1.676 m),  weight 159 lb (72.122 kg), SpO2 98 %. VS: noted--normal. Gen: alert, NAD, NONTOXIC APPEARING. HEENT: eyes without injection, drainage, or swelling.  Ears: EACs clear, TMs with normal light reflex and landmarks.  Nose: Clear rhinorrhea, with some dried, crusty exudate adherent to mildly injected mucosa.  No purulent d/c.  No paranasal sinus TTP.  No facial swelling.  Throat and mouth without focal lesion.  No pharyngial swelling, erythema, or exudate.   Neck: supple, no LAD.   LUNGS: CTA bilat, nonlabored resps.   CV: RRR, no m/r/g. EXT: no c/c/e SKIN: no rash  IMPRESSION AND PLAN:  Acute bronchitis, no RAD on exam today but she does have a hx of mild intermittent asthma. Recommended prednisone 40mg  qd x 5d, continue mucinex and get the one with DM. Use ProAir 2 puffs q4h prn.  Hycodan syrup, 1-2 tsp qhs prn, #131ml. Signs/symptoms to call or return for were reviewed and pt expressed understanding.  An After Visit Summary was printed and given to the patient.  FOLLOW UP: prn

## 2014-07-06 ENCOUNTER — Other Ambulatory Visit: Payer: Self-pay | Admitting: Family Medicine

## 2014-07-06 MED ORDER — ALBUTEROL SULFATE HFA 108 (90 BASE) MCG/ACT IN AERS: 2.0000 | INHALATION_SPRAY | RESPIRATORY_TRACT | Status: DC | PRN

## 2014-07-07 ENCOUNTER — Encounter: Payer: Self-pay | Admitting: Family Medicine

## 2014-07-07 ENCOUNTER — Ambulatory Visit (INDEPENDENT_AMBULATORY_CARE_PROVIDER_SITE_OTHER): Payer: 59 | Admitting: Family Medicine

## 2014-07-07 VITALS — BP 124/78 | HR 68 | Temp 99.0°F | Resp 16 | Wt 159.0 lb

## 2014-07-07 DIAGNOSIS — J4531 Mild persistent asthma with (acute) exacerbation: Secondary | ICD-10-CM | POA: Diagnosis not present

## 2014-07-07 DIAGNOSIS — J3089 Other allergic rhinitis: Secondary | ICD-10-CM | POA: Diagnosis not present

## 2014-07-07 MED ORDER — PREDNISONE 20 MG PO TABS: ORAL_TABLET | ORAL | Status: DC

## 2014-07-07 MED ORDER — BECLOMETHASONE DIPROPIONATE 80 MCG/ACT IN AERS: INHALATION_SPRAY | RESPIRATORY_TRACT | Status: DC

## 2014-07-07 MED ORDER — HYDROCODONE-HOMATROPINE 5-1.5 MG/5ML PO SYRP: ORAL_SOLUTION | ORAL | Status: DC

## 2014-07-07 NOTE — Progress Notes (Signed)
Pre visit review using our clinic review tool, if applicable. No additional management support is needed unless otherwise documented below in the visit note. 

## 2014-07-07 NOTE — Progress Notes (Signed)
OFFICE NOTE  07/07/2014  CC:  Chief Complaint  Patient presents with  . Cough    x 2 months, started when she was cleaning her fathers house and found some mold and continued to clean without a mask     HPI: Patient is a 64 y.o. Caucasian female who is here for ongoing cough. Onset of cough about 6 days ago, started when she was cleaning a bathroom that she noted had a lot of mold and dust. Minimal nasal congestion, no ST, no fever.  Says she is wheezing.  Cough occ productive of slight mucous that she swallows.  I saw her for similar illness --viral induced--about 6 wks ago and sx's cleared quickly with prednisone and albuterol use.  Pertinent PMH:  Past medical, surgical, social, and family history reviewed and no changes are noted since last office visit. +Mild intermittent asthma, never smoker CXR normal 2012.  MEDS:  Outpatient Prescriptions Prior to Visit  Medication Sig Dispense Refill  . albuterol (PROAIR HFA) 108 (90 BASE) MCG/ACT inhaler Inhale 2 puffs into the lungs every 4 (four) hours as needed. 18 g 1  . calcium carbonate (OS-CAL) 600 MG TABS Take 600 mg by mouth daily.    . Cholecalciferol (VITAMIN D3) 1000 UNITS tablet Take 1,000 Units by mouth daily.      . fish oil-omega-3 fatty acids 1000 MG capsule Take 2 g by mouth daily.      . Multiple Vitamin (MULTIVITAMIN) tablet Take 1 tablet by mouth daily.    . temazepam (RESTORIL) 15 MG capsule 1-2 tabs po qhs prn insomnia 10 capsule 0  . HYDROcodone-homatropine (HYCODAN) 5-1.5 MG/5ML syrup 1-2 tsp po qhs prn cough (Patient not taking: Reported on 07/07/2014) 120 mL 0  . predniSONE (DELTASONE) 20 MG tablet 2 tabs po qd x 5d (Patient not taking: Reported on 07/07/2014) 10 tablet 0   No facility-administered medications prior to visit.    PE: Blood pressure 124/78, pulse 68, temperature 99 F (37.2 C), temperature source Temporal, resp. rate 16, weight 159 lb (72.122 kg), SpO2 98 %. VS: noted--normal. Gen: alert, NAD,  NONTOXIC APPEARING. HEENT: eyes without injection, drainage, or swelling.  Ears: EACs clear, TMs with normal light reflex and landmarks.  Nose: Clear rhinorrhea, with some dried, crusty exudate adherent to mildly injected mucosa.  No purulent d/c.  No paranasal sinus TTP.  No facial swelling.  Throat and mouth without focal lesion.  No pharyngial swelling, erythema, or exudate.   Neck: supple, no LAD.   LUNGS: CTA bilat, nonlabored resps.  With forced exp maneuver she does have immediate cough during mid exhalation and excessive post-exhalation coughing.   CV: RRR, no m/r/g. EXT: no c/c/e SKIN: no rash    IMPRESSION AND PLAN:  Allergic rhin + allergic bronchitis--mold and dust exposure has been excessive recently. With 2 systemic steroid burst requirements in the last 2 mo I feel she would be better treated as a mild persistent asthmatic, therefore I'll start her on daily QVAR 80 mcg, 1 puff bid.  Therapeutic expectations and side effect profile of medication discussed today.  Patient's questions answered. Prednisone 40mg  qd x 5d. ProAir HFA 1-2 q6h prn.  She has an aerochamber.  An After Visit Summary was printed and given to the patient.  FOLLOW UP: 3 mo

## 2014-12-08 ENCOUNTER — Other Ambulatory Visit: Payer: Self-pay | Admitting: Obstetrics and Gynecology

## 2014-12-19 ENCOUNTER — Other Ambulatory Visit: Payer: Self-pay

## 2014-12-19 ENCOUNTER — Encounter (HOSPITAL_COMMUNITY): Payer: Self-pay

## 2014-12-19 ENCOUNTER — Encounter (HOSPITAL_COMMUNITY)
Admission: RE | Admit: 2014-12-19 | Discharge: 2014-12-19 | Disposition: A | Discharge status: Home / Self Care | Payer: 59 | Source: Ambulatory Visit | Attending: Obstetrics and Gynecology | Admitting: Obstetrics and Gynecology

## 2014-12-19 DIAGNOSIS — N814 Uterovaginal prolapse, unspecified: Secondary | ICD-10-CM | POA: Diagnosis not present

## 2014-12-19 DIAGNOSIS — Z01818 Encounter for other preprocedural examination: Secondary | ICD-10-CM | POA: Insufficient documentation

## 2014-12-19 HISTORY — DX: Enthesopathy, unspecified: M77.9

## 2014-12-19 HISTORY — DX: Unspecified osteoarthritis, unspecified site: M19.90

## 2014-12-19 HISTORY — DX: Other intervertebral disc displacement, lumbar region: M51.26

## 2014-12-19 LAB — COMPREHENSIVE METABOLIC PANEL
ALT: 25 U/L (ref 14–54)
AST: 17 U/L (ref 15–41)
Albumin: 4 g/dL (ref 3.5–5.0)
Alkaline Phosphatase: 47 U/L (ref 38–126)
Anion gap: 8 (ref 5–15)
BUN: 16 mg/dL (ref 6–20)
CO2: 28 mmol/L (ref 22–32)
Calcium: 9.5 mg/dL (ref 8.9–10.3)
Chloride: 108 mmol/L (ref 101–111)
Creatinine, Ser: 1.06 mg/dL — ABNORMAL HIGH (ref 0.44–1.00)
GFR calc Af Amer: >60 mL/min (ref >60)
GFR calc non Af Amer: 55 mL/min — ABNORMAL LOW (ref >60)
Glucose, Bld: 90 mg/dL (ref 65–99)
Potassium: 5 mmol/L (ref 3.5–5.1)
Sodium: 144 mmol/L (ref 135–145)
Total Bilirubin: 0.4 mg/dL (ref 0.3–1.2)
Total Protein: 6.7 g/dL (ref 6.5–8.1)

## 2014-12-19 LAB — CBC WITH DIFFERENTIAL/PLATELET
Basophils Absolute: 0 10*3/uL (ref 0.0–0.1)
Basophils Relative: 0 %
Eosinophils Absolute: 0.1 10*3/uL (ref 0.0–0.7)
Eosinophils Relative: 1 %
HCT: 39.1 % (ref 36.0–46.0)
Hemoglobin: 13 g/dL (ref 12.0–15.0)
Lymphocytes Relative: 41 %
Lymphs Abs: 2.3 10*3/uL (ref 0.7–4.0)
MCH: 31.4 pg (ref 26.0–34.0)
MCHC: 33.2 g/dL (ref 30.0–36.0)
MCV: 94.4 fL (ref 78.0–100.0)
Monocytes Absolute: 0.8 10*3/uL (ref 0.1–1.0)
Monocytes Relative: 14 %
Neutro Abs: 2.4 10*3/uL (ref 1.7–7.7)
Neutrophils Relative %: 44 %
Platelets: 234 10*3/uL (ref 150–400)
RBC: 4.14 MIL/uL (ref 3.87–5.11)
RDW: 12.9 % (ref 11.5–15.5)
WBC: 5.6 10*3/uL (ref 4.0–10.5)

## 2014-12-19 LAB — ABO/RH: ABO/RH(D): A POS

## 2014-12-19 LAB — TYPE AND SCREEN
ABO/RH(D): A POS
Antibody Screen: NEGATIVE

## 2014-12-19 NOTE — Patient Instructions (Addendum)
Your procedure is scheduled on:  Monday, December 25, 2014  Enter through the Main Entrance of [facility] at: 6:00 A.M. Pick up the phone at the desk and dial [phone].  Call this number if you have problems the morning of surgery: [phone].  Remember: Do NOT eat food or drink after: MIDNIGHT SUNDAY Take these medicines the morning of surgery with a SIP OF WATER:  OMEPRAZOLE  *STOP TAKING FISH OIL   *BRING IN ASTHMA INHALERS DAY OF SURGERY  Do NOT wear jewelry (body piercing), metal hair clips/bobby pins, make-up, or nail polish. Do NOT wear lotions, powders, or perfumes.  You may wear deoderant. Do NOT shave for 48 hours prior to surgery. Do NOT bring valuables to the hospital. Contacts, dentures, or bridgework may not be worn into surgery. Leave suitcase in car.  After surgery it may be brought to your room.  For patients admitted to the hospital, checkout time is 11:00 AM the day of discharge.  *Please bring in a copy of your living will/healthcare power of attorney

## 2014-12-24 ENCOUNTER — Other Ambulatory Visit: Payer: Self-pay | Admitting: Obstetrics and Gynecology

## 2014-12-24 NOTE — H&P (Signed)
[name] [name], [name] [name]               ACCOUNT NO.:  [id number]  MEDICAL RECORD NO.:  [id number]  LOCATION:  [facility]                         FACILITY:  [facility]  PHYSICIAN:  Lucille Passy. Ulanda Edison, M.D. DATE OF BIRTH:  April 07, 1950  DATE OF ADMISSION:  11/29/2014 DATE OF DISCHARGE:                             HISTORY & PHYSICAL   DATE OF ADMISSION:  December 25, 2014  HISTORY OF PRESENT ILLNESS:  This is a 64 year old white female, para 1- 0-0-1, who is admitted to the hospital for a vaginal hysterectomy, A and P repair because of uterine prolapse and cystocele and rectocele.  This patient was seen in our office on October 03, 2014, at that time, she had been rebuilding her father's house and was noted to have a third-degree prolapse of the uterus.  Other than that, her examination was okay, she called back in on November 20, 2014, and I wanted to discuss her prolapse options.  I talked to her on September 20.  She wanted surgery for the uterine prolapse.  She did not have stress urinary incontinence. I informed her that her prolapse was not serious.  The progression could not be defined and the patient wanted to go ahead with the surgery.  I explained her the disadvantages and advantages of progressing with surgery.  She said that she wanted to progress with surgery.  She stated she did not use her fingers to manipulate her stool, but she did notice when she was on her feet that she has a bulge at the opening.  She especially notices it when she gets out of the shower.  PAST MEDICAL HISTORY:  Reveals no significant illnesses.  PAST SURGICAL HISTORY:  She had colposcopy in January 9 without any significant findings.  She had left shoulder surgery in 2002, a tubal ligation in 1986, left foot surgery in 2008, and a left salpingo- oophorectomy laparoscopically for complex left adnexal mass in 2008.  ALLERGIES:  SULFA.  NO LATEX ALLERGY.  FAMILY HISTORY:  Paternal cousin has cancer of the breast,  mother congestive heart failure and diabetes.  MEDICATIONS:  The patient takes QVAR 80 mcg in metered-dose inhaler p.r.n., she has in the past used Estrace vaginal cream 1 g twice weekly.  PHYSICAL EXAMINATION:  VITAL SIGNS:  On admission, this is a well- developed and well-nourished white female, in no distress.  She was examined on October 18.  Her height was 5 feet 6 inches, weight 164, blood pressure 118/70, and pulse was 80. HEAD, EYES, NOSE, AND THROAT:  Normal. LUNGS:  Clear to auscultation. HEART:  Normal size and sounds.  No murmurs. ABDOMEN:  Soft and nontender.  No masses palpable. BREASTS:  Without masses. PELVIC:  She did have a second-degree cystocele and rectocele.  The cervix actually was higher on her exam that day than it was on her annual exam.  I informed her of this, but she had quit working on her father's house, using less strenuous activity.  She reiterated that she occasionally felt the prolapse especially in the shower, she wanted surgery before it got worse.  Her husband was diabetic, she was not worried about the impact of the surgery on her sexual  function.  I advised her of the surgery was not life-saving and is elective and she wanted to proceed.  ADMITTING IMPRESSION:  Uterine prolapse, cystocele, and rectocele.  The patient is admitted for vaginal hysterectomy, A and P repair.  I have gone over the potential complications with the patient.  She understands those and is ready to proceed.  I have informed her that there could be injury to surrounding structures, hemorrhage with need for reoperation and/or transfusion, intestinal problems, problems with her lungs, and infection.  She understands and is ready to proceed.     Lucille Passy. Ulanda Edison, M.D.     TFH/MEDQ  D:  12/24/2014  T:  12/24/2014  Job:  [id number]

## 2014-12-25 ENCOUNTER — Observation Stay (HOSPITAL_COMMUNITY)
Admission: RE | Admit: 2014-12-25 | Discharge: 2014-12-26 | Disposition: A | Discharge status: Home / Self Care | Payer: 59 | Source: Ambulatory Visit | Attending: Obstetrics and Gynecology | Admitting: Obstetrics and Gynecology

## 2014-12-25 ENCOUNTER — Encounter (HOSPITAL_COMMUNITY): Payer: Self-pay

## 2014-12-25 ENCOUNTER — Ambulatory Visit (HOSPITAL_COMMUNITY): Payer: 59 | Admitting: Anesthesiology

## 2014-12-25 ENCOUNTER — Encounter (HOSPITAL_COMMUNITY)
Admission: RE | Disposition: A | Discharge status: Home / Self Care | Payer: Self-pay | Source: Ambulatory Visit | Attending: Obstetrics and Gynecology

## 2014-12-25 DIAGNOSIS — N816 Rectocele: Secondary | ICD-10-CM | POA: Insufficient documentation

## 2014-12-25 DIAGNOSIS — Z882 Allergy status to sulfonamides status: Secondary | ICD-10-CM | POA: Diagnosis not present

## 2014-12-25 DIAGNOSIS — J45909 Unspecified asthma, uncomplicated: Secondary | ICD-10-CM | POA: Insufficient documentation

## 2014-12-25 DIAGNOSIS — K219 Gastro-esophageal reflux disease without esophagitis: Secondary | ICD-10-CM | POA: Diagnosis not present

## 2014-12-25 DIAGNOSIS — N814 Uterovaginal prolapse, unspecified: Secondary | ICD-10-CM | POA: Diagnosis present

## 2014-12-25 DIAGNOSIS — Z90710 Acquired absence of both cervix and uterus: Secondary | ICD-10-CM | POA: Diagnosis present

## 2014-12-25 HISTORY — PX: VAGINAL HYSTERECTOMY: SHX2639

## 2014-12-25 HISTORY — PX: ANTERIOR (CYSTOCELE) AND POSTERIOR REPAIR (RECTOCELE) WITH XENFORM GRAFT AND SACROSPINOUS FIXATION: SHX6492

## 2014-12-25 LAB — CBC
HCT: 33.5 % — ABNORMAL LOW (ref 36.0–46.0)
Hemoglobin: 11.1 g/dL — ABNORMAL LOW (ref 12.0–15.0)
MCH: 31.6 pg (ref 26.0–34.0)
MCHC: 33.1 g/dL (ref 30.0–36.0)
MCV: 95.4 fL (ref 78.0–100.0)
Platelets: 228 10*3/uL (ref 150–400)
RBC: 3.51 MIL/uL — ABNORMAL LOW (ref 3.87–5.11)
RDW: 13 % (ref 11.5–15.5)
WBC: 14.7 10*3/uL — ABNORMAL HIGH (ref 4.0–10.5)

## 2014-12-25 LAB — CREATININE, SERUM
Creatinine, Ser: 0.71 mg/dL (ref 0.44–1.00)
GFR calc Af Amer: >60 mL/min (ref >60)
GFR calc non Af Amer: >60 mL/min (ref >60)

## 2014-12-25 SURGERY — HYSTERECTOMY, VAGINAL
Anesthesia: General

## 2014-12-25 MED ORDER — LIDOCAINE HCL (CARDIAC) 20 MG/ML IV SOLN
INTRAVENOUS | Status: DC | PRN
  Administered 2014-12-25: 60 mg via INTRAVENOUS

## 2014-12-25 MED ORDER — CEFAZOLIN SODIUM-DEXTROSE 2-3 GM-% IV SOLR: 2.0000 g | Freq: Three times a day (TID) | INTRAVENOUS | Status: DC

## 2014-12-25 MED ORDER — EPHEDRINE 5 MG/ML INJ
INTRAVENOUS | Status: AC
  Filled 2014-12-25: qty 10

## 2014-12-25 MED ORDER — HYDROMORPHONE HCL 1 MG/ML IJ SOLN
INTRAMUSCULAR | Status: AC
  Administered 2014-12-25: 0.5 mg via INTRAVENOUS
  Filled 2014-12-25: qty 1

## 2014-12-25 MED ORDER — PROPOFOL 10 MG/ML IV BOLUS
INTRAVENOUS | Status: AC
  Filled 2014-12-25: qty 20

## 2014-12-25 MED ORDER — HYDROMORPHONE HCL 1 MG/ML IJ SOLN
0.2500 mg | INTRAMUSCULAR | Status: DC | PRN
  Administered 2014-12-25 (×2): 0.5 mg via INTRAVENOUS

## 2014-12-25 MED ORDER — ROCURONIUM BROMIDE 100 MG/10ML IV SOLN
INTRAVENOUS | Status: DC | PRN
  Administered 2014-12-25: 40 mg via INTRAVENOUS
  Administered 2014-12-25 (×2): 5 mg via INTRAVENOUS

## 2014-12-25 MED ORDER — FENTANYL CITRATE (PF) 100 MCG/2ML IJ SOLN
INTRAMUSCULAR | Status: DC | PRN
  Administered 2014-12-25: 50 ug via INTRAVENOUS
  Administered 2014-12-25 (×2): 25 ug via INTRAVENOUS
  Administered 2014-12-25 (×3): 50 ug via INTRAVENOUS

## 2014-12-25 MED ORDER — ACETAMINOPHEN 160 MG/5ML PO SOLN
975.0000 mg | Freq: Once | ORAL | Status: AC
  Administered 2014-12-25: 975 mg via ORAL

## 2014-12-25 MED ORDER — CEFAZOLIN SODIUM-DEXTROSE 2-3 GM-% IV SOLR: 2.0000 g | INTRAVENOUS | Status: DC

## 2014-12-25 MED ORDER — ONDANSETRON HCL 4 MG PO TABS: 4.0000 mg | ORAL_TABLET | Freq: Four times a day (QID) | ORAL | Status: DC | PRN

## 2014-12-25 MED ORDER — PHENYLEPHRINE HCL 10 MG/ML IJ SOLN
INTRAMUSCULAR | Status: DC | PRN
  Administered 2014-12-25 (×2): 40 ug via INTRAVENOUS

## 2014-12-25 MED ORDER — PROPOFOL 10 MG/ML IV BOLUS
INTRAVENOUS | Status: DC | PRN
  Administered 2014-12-25: 180 mg via INTRAVENOUS

## 2014-12-25 MED ORDER — OXYCODONE-ACETAMINOPHEN 5-325 MG PO TABS
1.0000 | ORAL_TABLET | Freq: Four times a day (QID) | ORAL | Status: DC | PRN
  Administered 2014-12-26: 1 via ORAL
  Filled 2014-12-25: qty 1

## 2014-12-25 MED ORDER — MEPERIDINE HCL 25 MG/ML IJ SOLN
INTRAMUSCULAR | Status: AC
  Administered 2014-12-25: 12.5 mg via INTRAVENOUS
  Filled 2014-12-25: qty 1

## 2014-12-25 MED ORDER — DEXAMETHASONE SODIUM PHOSPHATE 4 MG/ML IJ SOLN
INTRAMUSCULAR | Status: AC
  Filled 2014-12-25: qty 1

## 2014-12-25 MED ORDER — SCOPOLAMINE 1 MG/3DAYS TD PT72
1.0000 | MEDICATED_PATCH | Freq: Once | TRANSDERMAL | Status: DC
  Administered 2014-12-25: 1.5 mg via TRANSDERMAL

## 2014-12-25 MED ORDER — NEOSTIGMINE METHYLSULFATE 10 MG/10ML IV SOLN
INTRAVENOUS | Status: AC
  Filled 2014-12-25: qty 1

## 2014-12-25 MED ORDER — DIPHENHYDRAMINE HCL 50 MG/ML IJ SOLN: 12.5000 mg | Freq: Four times a day (QID) | INTRAMUSCULAR | Status: DC | PRN

## 2014-12-25 MED ORDER — FENTANYL CITRATE (PF) 250 MCG/5ML IJ SOLN
INTRAMUSCULAR | Status: AC
  Filled 2014-12-25: qty 25

## 2014-12-25 MED ORDER — ONDANSETRON HCL 4 MG/2ML IJ SOLN
INTRAMUSCULAR | Status: DC | PRN
  Administered 2014-12-25: 4 mg via INTRAVENOUS

## 2014-12-25 MED ORDER — GLYCOPYRROLATE 0.2 MG/ML IJ SOLN
INTRAMUSCULAR | Status: AC
  Filled 2014-12-25: qty 2

## 2014-12-25 MED ORDER — SODIUM CHLORIDE 0.9 % IJ SOLN: 9.0000 mL | INTRAMUSCULAR | Status: DC | PRN

## 2014-12-25 MED ORDER — ONDANSETRON HCL 4 MG/2ML IJ SOLN
INTRAMUSCULAR | Status: AC
  Filled 2014-12-25: qty 2

## 2014-12-25 MED ORDER — CEFAZOLIN SODIUM-DEXTROSE 2-3 GM-% IV SOLR
INTRAVENOUS | Status: AC
  Filled 2014-12-25: qty 50

## 2014-12-25 MED ORDER — HEPARIN SODIUM (PORCINE) 5000 UNIT/ML IJ SOLN
5000.0000 [IU] | Freq: Three times a day (TID) | INTRAMUSCULAR | Status: AC
  Administered 2014-12-25 – 2014-12-26 (×3): 5000 [IU] via SUBCUTANEOUS
  Filled 2014-12-25 (×3): qty 1

## 2014-12-25 MED ORDER — LIDOCAINE HCL (CARDIAC) 20 MG/ML IV SOLN
INTRAVENOUS | Status: AC
  Filled 2014-12-25: qty 5

## 2014-12-25 MED ORDER — CEFAZOLIN SODIUM-DEXTROSE 2-3 GM-% IV SOLR
2.0000 g | INTRAVENOUS | Status: AC
  Administered 2014-12-25: 2 g via INTRAVENOUS

## 2014-12-25 MED ORDER — NEOSTIGMINE METHYLSULFATE 10 MG/10ML IV SOLN
INTRAVENOUS | Status: DC | PRN
  Administered 2014-12-25: 3 mg via INTRAVENOUS

## 2014-12-25 MED ORDER — DIPHENHYDRAMINE HCL 12.5 MG/5ML PO ELIX: 12.5000 mg | ORAL_SOLUTION | Freq: Four times a day (QID) | ORAL | Status: DC | PRN

## 2014-12-25 MED ORDER — HEPARIN SODIUM (PORCINE) 5000 UNIT/ML IJ SOLN
5000.0000 [IU] | INTRAMUSCULAR | Status: AC
  Administered 2014-12-25: 5000 [IU] via SUBCUTANEOUS

## 2014-12-25 MED ORDER — CEFAZOLIN SODIUM-DEXTROSE 2-3 GM-% IV SOLR
2.0000 g | Freq: Three times a day (TID) | INTRAVENOUS | Status: AC
  Administered 2014-12-25 (×2): 2 g via INTRAVENOUS
  Filled 2014-12-25 (×2): qty 50

## 2014-12-25 MED ORDER — ONDANSETRON HCL 4 MG/2ML IJ SOLN: 4.0000 mg | Freq: Four times a day (QID) | INTRAMUSCULAR | Status: DC | PRN

## 2014-12-25 MED ORDER — LACTATED RINGERS IV SOLN
INTRAVENOUS | Status: DC
  Administered 2014-12-25 (×2): via INTRAVENOUS

## 2014-12-25 MED ORDER — SODIUM CHLORIDE 0.9 % IV SOLN
Freq: Once | INTRAVENOUS | Status: AC
  Administered 2014-12-25: 15 mL via TOPICAL
  Filled 2014-12-25 (×2): qty 0.4

## 2014-12-25 MED ORDER — MEPERIDINE HCL 25 MG/ML IJ SOLN
12.5000 mg | Freq: Once | INTRAMUSCULAR | Status: DC
  Administered 2014-12-25: 12.5 mg via INTRAVENOUS

## 2014-12-25 MED ORDER — GLYCOPYRROLATE 0.2 MG/ML IJ SOLN
INTRAMUSCULAR | Status: DC | PRN
  Administered 2014-12-25: .4 mg via INTRAVENOUS

## 2014-12-25 MED ORDER — ROCURONIUM BROMIDE 100 MG/10ML IV SOLN
INTRAVENOUS | Status: AC
  Filled 2014-12-25: qty 1

## 2014-12-25 MED ORDER — DEXAMETHASONE SODIUM PHOSPHATE 4 MG/ML IJ SOLN
INTRAMUSCULAR | Status: DC | PRN
  Administered 2014-12-25: 4 mg via INTRAVENOUS

## 2014-12-25 MED ORDER — HYDROMORPHONE 1 MG/ML IV SOLN
INTRAVENOUS | Status: DC
  Administered 2014-12-25: 0.8 mg via INTRAVENOUS
  Administered 2014-12-25: 12:00:00 via INTRAVENOUS
  Administered 2014-12-25: 1.6 mg via INTRAVENOUS
  Administered 2014-12-25: 1.4 mg via INTRAVENOUS
  Administered 2014-12-26: 0.8 mg via INTRAVENOUS
  Administered 2014-12-26: 0.4 mg via INTRAVENOUS
  Filled 2014-12-25: qty 25

## 2014-12-25 MED ORDER — PHENYLEPHRINE 40 MCG/ML (10ML) SYRINGE FOR IV PUSH (FOR BLOOD PRESSURE SUPPORT)
PREFILLED_SYRINGE | INTRAVENOUS | Status: AC
  Filled 2014-12-25: qty 10

## 2014-12-25 MED ORDER — ACETAMINOPHEN 160 MG/5ML PO SOLN
ORAL | Status: AC
  Filled 2014-12-25: qty 40.6

## 2014-12-25 MED ORDER — MIDAZOLAM HCL 2 MG/2ML IJ SOLN
INTRAMUSCULAR | Status: DC | PRN
  Administered 2014-12-25: 1 mg via INTRAVENOUS

## 2014-12-25 MED ORDER — NALOXONE HCL 0.4 MG/ML IJ SOLN: 0.4000 mg | INTRAMUSCULAR | Status: DC | PRN

## 2014-12-25 MED ORDER — SCOPOLAMINE 1 MG/3DAYS TD PT72
MEDICATED_PATCH | TRANSDERMAL | Status: AC
  Filled 2014-12-25: qty 1

## 2014-12-25 MED ORDER — LACTATED RINGERS IV SOLN
INTRAVENOUS | Status: DC
  Administered 2014-12-25 (×2): via INTRAVENOUS

## 2014-12-25 MED ORDER — EPHEDRINE SULFATE 50 MG/ML IJ SOLN
INTRAMUSCULAR | Status: DC | PRN
  Administered 2014-12-25: 10 mg via INTRAVENOUS

## 2014-12-25 MED ORDER — MIDAZOLAM HCL 2 MG/2ML IJ SOLN
INTRAMUSCULAR | Status: AC
  Filled 2014-12-25: qty 4

## 2014-12-25 SURGICAL SUPPLY — 31 items
CANISTER SUCT 3000ML (MISCELLANEOUS) ×2 IMPLANT
CLOTH BEACON ORANGE TIMEOUT ST (SAFETY) ×2 IMPLANT
CONT PATH 16OZ SNAP LID 3702 (MISCELLANEOUS) ×1 IMPLANT
DECANTER SPIKE VIAL GLASS SM (MISCELLANEOUS) ×1 IMPLANT
DRAPE STERI URO 9X17 APER PCH (DRAPES) ×2 IMPLANT
GAUZE PACKING IODOFORM 2 (PACKING) ×1 IMPLANT
GLOVE BIO SURGEON STRL SZ7 (GLOVE) ×3 IMPLANT
GLOVE BIO SURGEON STRL SZ7.5 (GLOVE) ×3 IMPLANT
GLOVE BIOGEL PI IND STRL 6.5 (GLOVE) ×1 IMPLANT
GLOVE BIOGEL PI INDICATOR 6.5 (GLOVE) ×2
GOWN STRL REUS W/TWL LRG LVL3 (GOWN DISPOSABLE) ×8 IMPLANT
NDL MAYO CATGUT SZ4 TPR NDL (NEEDLE) ×1 IMPLANT
NDL SPNL 22GX3.5 QUINCKE BK (NEEDLE) IMPLANT
NEEDLE HYPO 22GX1.5 SAFETY (NEEDLE) ×1 IMPLANT
NEEDLE MAYO CATGUT SZ4 (NEEDLE) ×2 IMPLANT
NEEDLE SPNL 22GX3.5 QUINCKE BK (NEEDLE) ×2 IMPLANT
NS IRRIG 1000ML POUR BTL (IV SOLUTION) ×2 IMPLANT
PACK VAGINAL WOMENS (CUSTOM PROCEDURE TRAY) ×2 IMPLANT
PAD OB MATERNITY 4.3X12.25 (PERSONAL CARE ITEMS) ×2 IMPLANT
SUT VIC AB 0 CT1 18XCR BRD8 (SUTURE) ×3 IMPLANT
SUT VIC AB 0 CT1 27 (SUTURE) ×4
SUT VIC AB 0 CT1 27XBRD ANBCTR (SUTURE) ×2 IMPLANT
SUT VIC AB 0 CT1 8-18 (SUTURE) ×6
SUT VIC AB 1 CT1 36 (SUTURE) ×2 IMPLANT
SUT VIC AB 3-0 SH 27 (SUTURE) ×2
SUT VIC AB 3-0 SH 27X BRD (SUTURE) IMPLANT
SUT VICRYL 0 TIES 12 18 (SUTURE) ×2 IMPLANT
TOWEL OR 17X24 6PK STRL BLUE (TOWEL DISPOSABLE) ×4 IMPLANT
TRAY FOLEY CATH SILVER 14FR (SET/KITS/TRAYS/PACK) ×2 IMPLANT
WATER STERILE IRR 1000ML POUR (IV SOLUTION) ×2 IMPLANT
YANKAUER SUCT BULB TIP NO VENT (SUCTIONS) ×1 IMPLANT

## 2014-12-25 NOTE — Progress Notes (Signed)
Post-op check S/p TVH, A&P repair Doing ok, pain ok, no n/v Afeb, VSS, adequate clear urine Abd- soft Continue current care

## 2014-12-25 NOTE — Anesthesia Preprocedure Evaluation (Addendum)
Anesthesia Evaluation  Patient identified by MRN, date of birth, ID band Patient awake    Reviewed: Allergy & Precautions, H&P , Patient's Chart, lab work & pertinent test results, reviewed documented beta blocker date and time   Airway Mallampati: II  TM Distance: >3 FB Neck ROM: full    Dental no notable dental hx. (+)    Pulmonary    Pulmonary exam normal breath sounds clear to auscultation       Cardiovascular  Rhythm:regular Rate:Normal     Neuro/Psych    GI/Hepatic GERD  Medicated,  Endo/Other    Renal/GU      Musculoskeletal   Abdominal   Peds  Hematology   Anesthesia Other Findings GERD (gastroesophageal reflux disease)   Allergic rhinitis     Asthma             Reproductive/Obstetrics                            Anesthesia Physical Anesthesia Plan  ASA: II  Anesthesia Plan: General   Post-op Pain Management:    Induction: Intravenous  Airway Management Planned: Oral ETT  Additional Equipment:   Intra-op Plan:   Post-operative Plan: Extubation in OR  Informed Consent: I have reviewed the patients History and Physical, chart, labs and discussed the procedure including the risks, benefits and alternatives for the proposed anesthesia with the patient or authorized representative who has indicated his/her understanding and acceptance.   Dental Advisory Given and Dental advisory given  Plan Discussed with: CRNA and Surgeon  Anesthesia Plan Comments: (  Discussed general anesthesia, including possible nausea, instrumentation of airway, sore throat,pulmonary aspiration, etc. I asked if the were any outstanding questions, or  concerns before we proceeded. )        Anesthesia Quick Evaluation

## 2014-12-25 NOTE — Op Note (Signed)
[name] [name], [name] [name]               ACCOUNT NO.:  [id number]  MEDICAL RECORD NO.:  [id number]  LOCATION:  [facility]                          FACILITY:  [facility]  PHYSICIAN:  Lucille Passy. Ulanda Edison, M.D. DATE OF BIRTH:  [birth year]  DATE OF PROCEDURE:  12/25/2014 DATE OF DISCHARGE:                              OPERATIVE REPORT   PREOPERATIVE DIAGNOSES:  Uterine prolapse, cystocele, and rectocele.  POSTOPERATIVE DIAGNOSES:  Uterine prolapse, cystocele, and rectocele.  OPERATION:  Vaginal hysterectomy, anterior and posterior repair.  OPERATOR:  Lucille Passy. Ulanda Edison, M.D.  Terrence DupontTerri Piedra.  DESCRIPTION OF PROCEDURE:  The patient was brought to the operating room and placed under satisfactory general anesthesia.  She was intubated but apparently not in the proper location, so the endotracheal tube was removed and reinserted.  The patient was in lithotomy position in [place].  The vulva, vagina, and urethra were prepped with Betadine solution and draped as a sterile field.  The rectocele was seen protruding a little bit through the vaginal introitus.  The cervix was exposed and grasped with 2 Lahey clamps and drawn down.  The cervicovaginal junction was injected with a dilute solution of Neo- Synephrine.  A circumferential incision was made around the cervix.  The bladder was pushed to head.  The posterior cul-de-sac was identified and on the second attempt was entered.  A banana retractor was then placed. The uterosacral ligaments were clamped, cut, and suture ligated bilaterally.  The cardinal ligaments were handled in the same fashion. The uterine vessels were clamped, cut, and suture ligated bilaterally, and the upper pedicles were clamped across.  The upper pedicles were held.  I did not see the right tube and ovary.  The left tube and ovary had been removed.  A double ligature was placed across each upper pedicle.  Re-peritonealization was then done with a running suture of #1 Vicryl.   This was tied down, did not completely closed the peritoneal cavity.  The anterior vaginal mucosa was then separated from the cystocele to appoint a couple of centimeters distal to the urethral meatus.  The cystocele was developed, imbricated with interrupted sutures of 0 Vicryl. Vaginal mucosa that was redundant, was cut away and the vaginal mucosa was reunited in the midline with interrupted figure- of-eight sutures of 0 Vicryl.  This was carried down to include the entire cuff.  Prior to doing the cystocele repair, I had placed a suture through the uterosacral ligaments and held it so that I could pull that through the vaginal cuff at the end of the procedure.  The posterior vaginal mucosa was then cut across at the fourchette.  I then undermined the vaginal mucosa all the way to the cuff, developed the rectocele and imbricated it with interrupted sutures of 0 Vicryl, cut away redundant vaginal mucosa, and closed the vaginal mucosa in the midline with multiple interrupted figure-of-eight sutures of 0 Vicryl.  I sutured the perineum with 3-0 Vicryl.  There was one bleeder on the anterior vaginal mucosa that was controlled with a figure-of-eight suture of 0 Vicryl.  I had done a rectal exam after the rectocele was developed and there was no injury to the rectum.  I then exited the suture that was on the uterosacral ligaments through the vaginal mucosa at the cuff, tied it down and packed with 2 inch iodoform.  The patient seemed to tolerate the procedure well.  The nurse and anesthetist estimated blood loss at 150 mL.  Sponge and needle counts were correct, and she was returned to recovery in satisfactory condition.     Lucille Passy. Ulanda Edison, M.D.     TFH/MEDQ  D:  12/25/2014  T:  12/25/2014  Job:  [id number]

## 2014-12-25 NOTE — Anesthesia Postprocedure Evaluation (Signed)
  Anesthesia Post-op Note  Patient: [name] [name]  Procedure(s) Performed: Procedure(s): HYSTERECTOMY VAGINAL (N/A) ANTERIOR (CYSTOCELE) AND POSTERIOR REPAIR (RECTOCELE)  (N/A)  Patient Location: [facility]  Anesthesia Type:General  Level of Consciousness: awake, alert , oriented and patient cooperative  Airway and Oxygen Therapy: Patient Spontanous Breathing and Patient connected to nasal cannula oxygen  Post-op Pain: none  Post-op Assessment: Post-op Vital signs reviewed, Patient's Cardiovascular Status Stable, Respiratory Function Stable, Patent Airway and No signs of Nausea or vomiting              Post-op Vital Signs: Reviewed and stable  Last Vitals:  Filed Vitals:   12/25/14 1124  BP: 86/52  Pulse: 68  Temp: 36.8 C  Resp: 12    Complications: No apparent anesthesia complications

## 2014-12-25 NOTE — Progress Notes (Signed)
Patient ID: [name] [name], female   DOB: [birth year], 64 y.o.   MRN: [id number] I examined this lady 12-19-14 and she reports no change in her health since that time.

## 2014-12-25 NOTE — Anesthesia Procedure Notes (Signed)
Procedure Name: Intubation Date/Time: 12/25/2014 7:28 AM Performed by: Elenore Paddy Pre-anesthesia Checklist: Patient identified, Emergency Drugs available, Suction available, Timeout performed and Patient being monitored Patient Re-evaluated:Patient Re-evaluated prior to inductionOxygen Delivery Method: Circle system utilized Preoxygenation: Pre-oxygenation with 100% oxygen Intubation Type: IV induction Ventilation: Mask ventilation without difficulty Laryngoscope Size: Mac and 3 Grade View: Grade II Tube type: Oral Tube size: 7.0 mm Number of attempts: 2 (First attempt saw cords, passed ETT but no CO2 so ETT pulled out and passed second time, with ETCO2 and equal breath sounds) Airway Equipment and Method: Stylet Dental Injury: Teeth and Oropharynx as per pre-operative assessment

## 2014-12-25 NOTE — Transfer of Care (Signed)
Immediate Anesthesia Transfer of Care Note  Patient: [name] [name]  Procedure(s) Performed: Procedure(s): HYSTERECTOMY VAGINAL (N/A) ANTERIOR (CYSTOCELE) AND POSTERIOR REPAIR (RECTOCELE)  (N/A)  Patient Location: PACU  Anesthesia Type:General  Level of Consciousness: awake, alert  and oriented  Airway & Oxygen Therapy: Patient Spontanous Breathing and Patient connected to nasal cannula oxygen  Post-op Assessment: Report given to RN and Post -op Vital signs reviewed and stable  Post vital signs: Reviewed and stable  Last Vitals:  Filed Vitals:   12/25/14 0610  BP: 117/73  Pulse: 71  Temp: 36.7 C  Resp: 20    Complications: No apparent anesthesia complications

## 2014-12-25 NOTE — Progress Notes (Signed)
Patient ID: [name] [name], female   DOB: 01/26/1951, 64 y.o.   MRN: [id number] Op note:  [facility] A&P repair General anesthesia EBL 150cc's

## 2014-12-26 ENCOUNTER — Encounter (HOSPITAL_COMMUNITY): Payer: Self-pay | Admitting: Obstetrics and Gynecology

## 2014-12-26 DIAGNOSIS — N814 Uterovaginal prolapse, unspecified: Secondary | ICD-10-CM | POA: Diagnosis not present

## 2014-12-26 LAB — CBC
HCT: 35.3 % — ABNORMAL LOW (ref 36.0–46.0)
Hemoglobin: 11.5 g/dL — ABNORMAL LOW (ref 12.0–15.0)
MCH: 31.5 pg (ref 26.0–34.0)
MCHC: 32.6 g/dL (ref 30.0–36.0)
MCV: 96.7 fL (ref 78.0–100.0)
Platelets: 220 10*3/uL (ref 150–400)
RBC: 3.65 MIL/uL — ABNORMAL LOW (ref 3.87–5.11)
RDW: 13.1 % (ref 11.5–15.5)
WBC: 10.4 10*3/uL (ref 4.0–10.5)

## 2014-12-26 MED ORDER — OXYCODONE-ACETAMINOPHEN 5-325 MG PO TABS: 1.0000 | ORAL_TABLET | Freq: Four times a day (QID) | ORAL | Status: DC | PRN

## 2014-12-26 MED ORDER — IBUPROFEN 600 MG PO TABS: 600.0000 mg | ORAL_TABLET | Freq: Four times a day (QID) | ORAL | Status: DC | PRN

## 2014-12-26 NOTE — Discharge Instructions (Signed)
No vaginal entrance. Call with temp > 100. 4 degrees, heavy vaginal bleeding or any problems. Avoid lifting > 10 pounds

## 2014-12-26 NOTE — Progress Notes (Signed)
Patient ID: [name] [name], female   DOB: 17-May-1950, 64 y.o.   MRN: [id number] Pt is ready for d/c. Her HGB was higher post op than pre op. She wants d/c

## 2014-12-26 NOTE — Progress Notes (Signed)
Patient discharged home with husband... Discharge instructions reviewed with patient and she verbalized understanding of follow up appointment, and reasons to call MD. Condition stable... No equipment... Ambulated to car with Vaughan Basta, NT.

## 2014-12-26 NOTE — Progress Notes (Signed)
Patient ID: [name] [name], female   DOB: 24-Aug-1950, 64 y.o.   MRN: [id number] #1 afebrile BP normal No complaints Abdomen soft and not tender Catheter and pack are out She has not voided. She is ambulating well.Will check CBC and evaluate later for d/c.

## 2015-02-13 ENCOUNTER — Ambulatory Visit: Payer: 59 | Admitting: Family Medicine

## 2015-04-20 ENCOUNTER — Telehealth: Payer: Self-pay | Admitting: Family Medicine

## 2015-04-20 MED ORDER — OSELTAMIVIR PHOSPHATE 75 MG PO CAPS: 75.0000 mg | ORAL_CAPSULE | Freq: Two times a day (BID) | ORAL | Status: DC

## 2015-04-20 NOTE — Telephone Encounter (Signed)
Tamiflu eRx'd to [facility] on [address].

## 2015-04-20 NOTE — Telephone Encounter (Signed)
Please advise. Thanks.  

## 2015-04-20 NOTE — Telephone Encounter (Signed)
Patient requesting tamiflu since husband has been dx with flu.  Patient states she is actually experiencing sxs.  Cough, headache, ST.  Please advise.

## 2015-04-20 NOTE — Telephone Encounter (Signed)
Pt advised and voiced understanding.   

## 2015-05-10 ENCOUNTER — Ambulatory Visit: Payer: 59

## 2015-05-10 ENCOUNTER — Telehealth: Payer: Self-pay | Admitting: Family Medicine

## 2015-05-10 NOTE — Telephone Encounter (Signed)
We can give her the pneumonia vaccine in our office when she comes in for the flu shot.  Is there a reason why she specifically requested a rx for the shot?

## 2015-05-10 NOTE — Telephone Encounter (Signed)
Patient needs Rx for pneumonia vaccine. She will be here tomorrow morning for her flu shot, can she get it then?

## 2015-05-11 ENCOUNTER — Ambulatory Visit (INDEPENDENT_AMBULATORY_CARE_PROVIDER_SITE_OTHER): Payer: Managed Care, Other (non HMO) | Admitting: *Deleted

## 2015-05-11 DIAGNOSIS — Z23 Encounter for immunization: Secondary | ICD-10-CM | POA: Diagnosis not present

## 2015-05-11 NOTE — Telephone Encounter (Signed)
Pt was given pneumonia vaccine today in office by Vinnie Level.

## 2015-10-21 ENCOUNTER — Encounter: Payer: Self-pay | Admitting: Family Medicine

## 2016-04-03 ENCOUNTER — Ambulatory Visit (INDEPENDENT_AMBULATORY_CARE_PROVIDER_SITE_OTHER): Payer: No Typology Code available for payment source | Admitting: Family Medicine

## 2016-04-03 ENCOUNTER — Encounter: Payer: Self-pay | Admitting: Family Medicine

## 2016-04-03 VITALS — BP 123/81 | HR 87 | Temp 99.4°F | Resp 16 | Wt 174.0 lb

## 2016-04-03 DIAGNOSIS — J309 Allergic rhinitis, unspecified: Secondary | ICD-10-CM | POA: Diagnosis not present

## 2016-04-03 MED ORDER — PREDNISONE 20 MG PO TABS: ORAL_TABLET | ORAL | 0 refills | Status: DC

## 2016-04-03 NOTE — Progress Notes (Signed)
OFFICE VISIT  04/03/2016   CC:  Chief Complaint  Patient presents with  . Sinusitis    head congestion, sinus pressure     HPI:    Patient is a 66 y.o. Caucasian female who presents for respiratory symptoms.  Onset of sx's about 5-6 days ago. She is allergic to cats, had to go go a lady's house who had cats, next day she was congested in nose/sinuses, plus did some wheezing x 1 day.  Ibuprofen helps a little, nasal rinses help some.  Zyrtec and mucinex as well. Mild pain in maxillary areas.  No teeth pain.  No coughing.  No fever.  Past Medical History:  Diagnosis Date  . Allergic rhinitis   . Anxiety and depression   . Arthritis    neck  . Asthma   . Bone spur    neck  . Cataract    L. eye  . Chronic sinusitis    CT sinuses 2012, moderately severe.  . Diplopia   . Diverticulosis    patient denies  . GERD (gastroesophageal reflux disease)   . Herniated lumbar intervertebral disc   . Hypoglycemia   . Ligament tear of lower extremity    L. foot Dr. Wyman Songster  . Lumbago    and radiculopathy  . Sessile colonic polyp 2007   20mm    Past Surgical History:  Procedure Laterality Date  . ANTERIOR (CYSTOCELE) AND POSTERIOR REPAIR (RECTOCELE) WITH XENFORM GRAFT AND SACROSPINOUS FIXATION N/A 12/25/2014   Procedure: ANTERIOR (CYSTOCELE) AND POSTERIOR REPAIR (RECTOCELE) ;  Surgeon: Newton Pigg, MD;  Location: [facility];  Service: Gynecology;  Laterality: N/A;  . COLONOSCOPY W/ POLYPECTOMY  11/2005   INCOMPLETE - to transverse colon  - diverticulosis, 61mm polyp was hyperplastic  . FOOT SURGERY     left - Dr. Beola Cord 2009  . LAPAROSCOPIC SALPINGOOPHERECTOMY  05/05/2006   LSO for complex left adnexal mass:  PATH--serous cystadenofibroma  . SHOULDER SURGERY     impingement syndrome  . TUBAL LIGATION    . UPPER GASTROINTESTINAL ENDOSCOPY  06/2002,    with dilation, esophageal stenosis  . VAGINAL HYSTERECTOMY N/A 12/25/2014   Procedure: HYSTERECTOMY VAGINAL;  Surgeon: Newton Pigg,  MD;  Location: [facility];  Service: Gynecology;  Laterality: N/A;    Outpatient Medications Prior to Visit  Medication Sig Dispense Refill  . Cholecalciferol (VITAMIN D3) 1000 UNITS tablet Take 1,000 Units by mouth daily.      . fish oil-omega-3 fatty acids 1000 MG capsule Take 1 g by mouth daily.     Marland Kitchen ibuprofen (ADVIL,MOTRIN) 600 MG tablet Take 1 tablet (600 mg total) by mouth every 6 (six) hours as needed. 30 tablet 0  . Multiple Vitamin (MULTIVITAMIN) tablet Take 1 tablet by mouth daily.    Marland Kitchen omeprazole (PRILOSEC OTC) 20 MG tablet Take 20 mg by mouth daily as needed (for heartburn).     Marland Kitchen albuterol (PROAIR HFA) 108 (90 BASE) MCG/ACT inhaler Inhale 2 puffs into the lungs every 4 (four) hours as needed. (Patient not taking: Reported on 04/03/2016) 18 g 1  . beclomethasone (QVAR) 80 MCG/ACT inhaler 1 puff bid (Patient not taking: Reported on 04/03/2016) 1 Inhaler 12  . calcium carbonate (OS-CAL) 600 MG TABS Take 600 mg by mouth daily.    . clobetasol cream (TEMOVATE) [id number] % Apply 1 application topically 2 (two) times daily.    . mupirocin nasal ointment (BACTROBAN) 2 % Place 1 application into the nose 2 (two) times a week.    Marland Kitchen  oseltamivir (TAMIFLU) 75 MG capsule Take 1 capsule (75 mg total) by mouth 2 (two) times daily. (Patient not taking: Reported on 04/03/2016) 10 capsule 0  . OVER THE COUNTER MEDICATION Take 1 capsule by mouth daily as needed (for bowel movements). Dr Carolanne Grumbling intestional cleanse    . oxyCODONE-acetaminophen (PERCOCET/ROXICET) 5-325 MG tablet Take 1 tablet by mouth every 6 (six) hours as needed for moderate pain or severe pain (moderate to severe pain (when tolerating fluids)). (Patient not taking: Reported on 04/03/2016) 30 tablet 0   No facility-administered medications prior to visit.     Allergies  Allergen Reactions  . Ciprofloxacin Other (See Comments)    Pt. Reports a tendon issue s/p cipro  . Other Other (See Comments)    Bloating stomach  . Sulfonamide Derivatives  Other (See Comments)    Bloating stomach    ROS As per HPI  PE: Blood pressure 123/81, pulse 87, temperature 99.4 F (37.4 C), temperature source Temporal, resp. rate 16, weight 174 lb (78.9 kg), SpO2 98 %. VS: noted--normal. Gen: alert, NAD, NONTOXIC APPEARING. HEENT: eyes without injection, drainage, or swelling.  Ears: EACs clear, TMs with normal light reflex and landmarks.  Nose: Clear rhinorrhea, with some dried, crusty exudate adherent to mildly injected mucosa.  No purulent d/c.  No paranasal sinus TTP.  No facial swelling.  Throat and mouth without focal lesion.  No pharyngial swelling, erythema, or exudate.   Neck: supple, no LAD.   LUNGS: CTA bilat, nonlabored resps.  Trace exp wheeze but hard to tell if this may have been an upper airway pseudowheeze or true wheeze. CV: RRR, no m/r/g. EXT: no c/c/e SKIN: no rash   LABS:    Chemistry      Component Value Date/Time   NA 144 12/19/2014 1450   K 5.0 12/19/2014 1450   CL 108 12/19/2014 1450   CO2 28 12/19/2014 1450   BUN 16 12/19/2014 1450   CREATININE 0.71 12/25/2014 1220      Component Value Date/Time   CALCIUM 9.5 12/19/2014 1450   ALKPHOS 47 12/19/2014 1450   AST 17 12/19/2014 1450   ALT 25 12/19/2014 1450   BILITOT 0.4 12/19/2014 1450       IMPRESSION AND PLAN:  Allergic sinusitis with a trace of RAD:  Prednisone 40mg  qd x 5d. Flonase otc qd x 10-14d. Continue nasal saline rinses. May take 400 mg ibup tid with food for HA.  An After Visit Summary was printed and given to the patient.  FOLLOW UP: Return if symptoms worsen or fail to improve.  Signed:  Crissie Sickles, MD           04/03/2016

## 2016-04-03 NOTE — Patient Instructions (Signed)
Buy otc generic flonase and use every day for the next 10-14 days.

## 2016-04-28 ENCOUNTER — Telehealth: Payer: Self-pay | Admitting: Family Medicine

## 2016-04-28 NOTE — Telephone Encounter (Signed)
Please advise. Thanks.  

## 2016-04-28 NOTE — Telephone Encounter (Signed)
Please call patient to clarify the following below at [phone].  Patient's insurance [employer] said they will only cover her TB test if the MD codes the visit as a preventative care code. Patient would like to know if this is correct and familiar to the MD before getting TB test.

## 2016-05-05 ENCOUNTER — Ambulatory Visit: Payer: Self-pay

## 2016-05-05 ENCOUNTER — Ambulatory Visit (INDEPENDENT_AMBULATORY_CARE_PROVIDER_SITE_OTHER): Payer: Self-pay

## 2016-05-05 DIAGNOSIS — Z111 Encounter for screening for respiratory tuberculosis: Secondary | ICD-10-CM

## 2016-05-05 MED ORDER — TUBERCULIN PPD 5 UNIT/0.1ML ID SOLN: 5.0000 [IU] | Freq: Once | INTRADERMAL | Status: DC

## 2016-05-05 NOTE — Progress Notes (Signed)
TB skin test administered to left forearm. Patient tolerated well. Educated patient to come back in 48-72 hours for test to be read; understanding voiced to instructions.

## 2016-05-07 ENCOUNTER — Ambulatory Visit: Payer: Self-pay

## 2016-05-07 ENCOUNTER — Telehealth: Payer: Self-pay | Admitting: Family Medicine

## 2016-05-07 NOTE — Addendum Note (Signed)
Addended by: Golden Hurter on: 05/07/2016 09:05 AM   Modules accepted: Orders

## 2016-08-15 ENCOUNTER — Telehealth: Payer: Self-pay | Admitting: Family Medicine

## 2016-08-15 NOTE — Telephone Encounter (Signed)
Patient Name: [name] [name] DOB: Oct 14, 1950 Initial Comment Caller states she has nasal congestion, blowing out yellow discharge. Headache. Nurse Assessment Nurse: Ronnald Ramp, RN, Miranda Date/Time (Eastern Time): 08/15/2016 11:11:23 AM Confirm and document reason for call. If symptomatic, describe symptoms. ---Caller states she has had nasal congestion since yesterday and today has a cough as well. Denies fever. Does the patient have any new or worsening symptoms? ---Yes Will a triage be completed? ---Yes Related visit to physician within the last 2 weeks? ---No Does the PT have any chronic conditions? (i.e. diabetes, asthma, etc.) ---Yes List chronic conditions. ---GERD Is this a behavioral health or substance abuse call? ---No Guidelines Guideline Title Affirmed Question Affirmed Notes Sinus Pain or Congestion [1] Sinus congestion as part of a cold AND [2] present < 10 days Final Disposition User [place], RN, Miranda Comments Missed call. Parked to nurse. Disagree/Comply: Comply

## 2016-08-15 NOTE — Telephone Encounter (Signed)
FYI

## 2016-08-22 MED ORDER — BECLOMETHASONE DIPROPIONATE 80 MCG/ACT IN AERS: INHALATION_SPRAY | RESPIRATORY_TRACT | 3 refills | Status: DC

## 2016-08-22 NOTE — Telephone Encounter (Signed)
Patient requesting Rx for the inhaler that is not her rescue inhaler. She is not sure which one it is. Please send it to [facility]. Please do not call between 3:00-3:30.

## 2016-08-22 NOTE — Telephone Encounter (Signed)
Detailed message left on voice mail. Okay per DPR. 

## 2016-08-22 NOTE — Telephone Encounter (Signed)
OK: QVAR inhaler eRx'd to [facility].

## 2016-08-25 NOTE — Telephone Encounter (Signed)
Pt advised and voiced understanding.   

## 2016-08-25 NOTE — Telephone Encounter (Signed)
Left message for pt to call back  °

## 2016-08-25 NOTE — Telephone Encounter (Signed)
Pt must call her insurer and ask which inhaled corticosteroid is their preferred one on their drug formulary, then I can rx this med.-thx

## 2016-08-25 NOTE — Telephone Encounter (Signed)
Patient states that the QVAR inhaler was going to cost her over $200.  Is there an alternative?  Please advise.

## 2016-08-25 NOTE — Telephone Encounter (Signed)
Please advise. Thanks.  

## 2016-09-10 ENCOUNTER — Encounter: Payer: Self-pay | Admitting: Family Medicine

## 2016-09-10 ENCOUNTER — Ambulatory Visit (INDEPENDENT_AMBULATORY_CARE_PROVIDER_SITE_OTHER): Payer: PRIVATE HEALTH INSURANCE | Admitting: Family Medicine

## 2016-09-10 VITALS — BP 122/64 | HR 78 | Temp 98.1°F | Resp 18 | Wt 173.0 lb

## 2016-09-10 DIAGNOSIS — J069 Acute upper respiratory infection, unspecified: Secondary | ICD-10-CM

## 2016-09-10 DIAGNOSIS — J4531 Mild persistent asthma with (acute) exacerbation: Secondary | ICD-10-CM

## 2016-09-10 MED ORDER — PREDNISONE 20 MG PO TABS: ORAL_TABLET | ORAL | 0 refills | Status: DC

## 2016-09-10 MED ORDER — FLUTICASONE PROPIONATE (INHAL) 50 MCG/BLIST IN AEPB: 1.0000 | INHALATION_SPRAY | Freq: Two times a day (BID) | RESPIRATORY_TRACT | 6 refills | Status: DC

## 2016-09-10 NOTE — Patient Instructions (Signed)
Get otc generic robitussin DM OR Mucinex DM and use as directed on the packaging for cough and congestion. Use otc generic saline nasal spray 2-3 times per day to irrigate/moisturize your nasal passages.   

## 2016-09-10 NOTE — Progress Notes (Signed)
OFFICE VISIT  09/10/2016   CC:  Chief Complaint  Patient presents with  . Cough    x 1 month   HPI:    Patient is a 66 y.o. Caucasian female with history of mild persistent asthma and hx of mod/severe chronic pansinusitis (2012) who presents for cough. Onset about 1 mo ago with URI sx's (nasal cong, runny nose, sT), productive cough at first.  The cough has persisted, has to clear throat a lot, feeling some SOB with walking, taking deep breaths makes it worse.  Still some clear nasal secretions. Some wheezing occurring.  No fever.   Has been off her QVAR for about 1 yr.  Has been fine from respiratory standpoint since being out of this med---until 1 mo ago.  Has albuterol inhaler, using this but not with aerochamber until yesterday.  Before using aerochamber she used albuterol and says it only helped a few minutes at a time.   Past Medical History:  Diagnosis Date  . Allergic rhinitis   . Anxiety and depression   . Arthritis    neck  . Bone spur    neck  . Cataract    L. eye  . Chronic sinusitis    CT sinuses 2012, moderately severe.  . Diplopia   . Diverticulosis    patient denies  . GERD (gastroesophageal reflux disease)   . Herniated lumbar intervertebral disc   . Hypoglycemia   . Ligament tear of lower extremity    L. foot Dr. Wyman Songster  . Lumbago    and radiculopathy  . Mild persistent asthma   . Sessile colonic polyp 2007   47mm    Past Surgical History:  Procedure Laterality Date  . ANTERIOR (CYSTOCELE) AND POSTERIOR REPAIR (RECTOCELE) WITH XENFORM GRAFT AND SACROSPINOUS FIXATION N/A 12/25/2014   Procedure: ANTERIOR (CYSTOCELE) AND POSTERIOR REPAIR (RECTOCELE) ;  Surgeon: Newton Pigg, MD;  Location: [facility];  Service: Gynecology;  Laterality: N/A;  . COLONOSCOPY W/ POLYPECTOMY  11/2005   INCOMPLETE - to transverse colon  - diverticulosis, 17mm polyp was hyperplastic  . FOOT SURGERY     left - Dr. Beola Cord 2009  . LAPAROSCOPIC SALPINGOOPHERECTOMY  05/05/2006   LSO for complex left adnexal mass:  PATH--serous cystadenofibroma  . SHOULDER SURGERY     impingement syndrome  . TUBAL LIGATION    . UPPER GASTROINTESTINAL ENDOSCOPY  06/2002,    with dilation, esophageal stenosis  . VAGINAL HYSTERECTOMY N/A 12/25/2014   Procedure: HYSTERECTOMY VAGINAL;  Surgeon: Newton Pigg, MD;  Location: [facility];  Service: Gynecology;  Laterality: N/A;    Outpatient Medications Prior to Visit  Medication Sig Dispense Refill  . albuterol (PROAIR HFA) 108 (90 BASE) MCG/ACT inhaler Inhale 2 puffs into the lungs every 4 (four) hours as needed. 18 g 1  . calcium carbonate (OS-CAL) 600 MG TABS Take 600 mg by mouth daily.    . Cholecalciferol (VITAMIN D3) 1000 UNITS tablet Take 1,000 Units by mouth daily.      . fish oil-omega-3 fatty acids 1000 MG capsule Take 1 g by mouth daily.     . Multiple Vitamin (MULTIVITAMIN) tablet Take 1 tablet by mouth daily.    Marland Kitchen omeprazole (PRILOSEC OTC) 20 MG tablet Take 20 mg by mouth daily as needed (for heartburn).     Marland Kitchen OVER THE COUNTER MEDICATION Take 1 capsule by mouth daily as needed (for bowel movements). Dr Carolanne Grumbling intestional cleanse    . clobetasol cream (TEMOVATE) 9.02 % Apply 1 application topically 2 (  two) times daily.    Marland Kitchen ibuprofen (ADVIL,MOTRIN) 600 MG tablet Take 1 tablet (600 mg total) by mouth every 6 (six) hours as needed. (Patient not taking: Reported on 09/10/2016) 30 tablet 0  . mupirocin nasal ointment (BACTROBAN) 2 % Place 1 application into the nose 2 (two) times a week.    . beclomethasone (QVAR) 80 MCG/ACT inhaler 1 puff bid (Patient not taking: Reported on 09/10/2016) 3 Inhaler 3  . oseltamivir (TAMIFLU) 75 MG capsule Take 1 capsule (75 mg total) by mouth 2 (two) times daily. (Patient not taking: Reported on 04/03/2016) 10 capsule 0  . oxyCODONE-acetaminophen (PERCOCET/ROXICET) 5-325 MG tablet Take 1 tablet by mouth every 6 (six) hours as needed for moderate pain or severe pain (moderate to severe pain (when tolerating  fluids)). (Patient not taking: Reported on 04/03/2016) 30 tablet 0  . predniSONE (DELTASONE) 20 MG tablet 2 tabs po qd x 5d (Patient not taking: Reported on 09/10/2016) 10 tablet 0  . tuberculin injection 5 Units      No facility-administered medications prior to visit.     Allergies  Allergen Reactions  . Ciprofloxacin Other (See Comments)    Pt. Reports a tendon issue s/p cipro  . Other Other (See Comments)    Bloating stomach  . Sulfonamide Derivatives Other (See Comments)    Bloating stomach    ROS As per HPI  PE: Blood pressure 122/64, pulse 78, temperature 98.1 F (36.7 C), temperature source Oral, resp. rate 18, weight 173 lb (78.5 kg), SpO2 98 %. VS: noted--normal. Gen: alert, NAD, NONTOXIC APPEARING. HEENT: eyes without injection, drainage, or swelling.  Ears: EACs clear, TMs with normal light reflex and landmarks.  Nose: Clear rhinorrhea, with some dried, crusty exudate adherent to mildly injected mucosa.  No purulent d/c.  No paranasal sinus TTP.  No facial swelling.  Throat and mouth without focal lesion.  No pharyngial swelling, erythema, or exudate.   Neck: supple, no LAD.   LUNGS: CTA bilat on insp, decreased aeration on exp with forced exp maneuver--wheezing at end expiration as well, lots of coughing in midst of deep breaths, nonlabored resps.   CV: RRR, no m/r/g. EXT: no c/c/e SKIN: no rash  LABS:  none  IMPRESSION AND PLAN:  Acute exacerbation of mild persistent asthma, with viral URI resolving. No sign of bacterial infection, pt is not a smoker. Prednisone 40mg  qd x 5d, then 20 mg qd x 5d. Albuterol 2 p q6h prn. Restart inhaled steroid: flovent 44 mcg diskus (insurance preferred). Stressed the importance of staying on this med EVERY day as asthma preventative/controller med. Get otc generic robitussin DM OR Mucinex DM and use as directed on the packaging for cough and congestion. Use otc generic saline nasal spray 2-3 times per day to irrigate/moisturize  your nasal passages.  An After Visit Summary was printed and given to the patient.  FOLLOW UP: Return in about 3 months (around 12/11/2016) for f/u asthma.  Signed:  Crissie Sickles, MD           09/10/2016

## 2016-11-20 ENCOUNTER — Ambulatory Visit (INDEPENDENT_AMBULATORY_CARE_PROVIDER_SITE_OTHER): Payer: PRIVATE HEALTH INSURANCE | Admitting: Emergency Medicine

## 2016-11-20 DIAGNOSIS — Z23 Encounter for immunization: Secondary | ICD-10-CM

## 2016-12-22 ENCOUNTER — Telehealth: Payer: Self-pay | Admitting: *Deleted

## 2016-12-22 NOTE — Telephone Encounter (Signed)
Noted  

## 2016-12-22 NOTE — Telephone Encounter (Signed)
PLEASE NOTE: All timestamps contained within this report are represented as [place] Standard Time. CONFIDENTIALTY NOTICE: This fax transmission is intended only for the addressee. It contains information that is legally privileged, confidential or otherwise protected from use or disclosure. If you are not the intended recipient, you are strictly prohibited from reviewing, disclosing, copying using or disseminating any of this information or taking any action in reliance on or regarding this information. If you have received this fax in error, please notify us immediately by telephone so that we can arrange for its return to [place]. Phone: [phone], Toll-Free: [phone], Fax: [phone] Page: 1 of 2 Call Id: [id number] [place] Patient Name: [name] [name] Gender: Female DOB: [birth year] Age: 66 Y 2 M 24 D Return Phone Number: [phone] (Primary) Address: City/State/Zip: [place] [address] Client [facility] - Client Client Site [facility] Physician Crissie Sickles - MD Contact Type Call Who Is Calling Patient / Member / Family / Caregiver Call Type Triage / Clinical Relationship To Patient Self Return Phone Number [phone] (Primary) Chief Complaint Sore Throat Reason for Call Symptomatic / Request for [place] states she has an irritated throat. Caller thought that it could be from petroleum being put on the house to water proof it, but is has persisted. Could it be Strep? Translation No Nurse Assessment Nurse: Loreen Freud, RN, Crystal Date/Time (Eastern Time): 12/21/2016 5:18:05 PM Confirm and document reason for call. If symptomatic, describe symptoms. ---Caller states she has an irritated throat, had petroleum put on the house to waterproof it Thursday or Friday. Started flovent BID, throat is red with  whitish patches on tonsils, no fever. Sore throat started on Thursday, husband had a sore throat, runny nose, cough. Does the patient have any new or worsening symptoms? ---Yes Will a triage be completed? ---Yes Related visit to physician within the last 2 weeks? ---N/A Does the PT have any chronic conditions? (i.e. diabetes, asthma, etc.) ---No Is this a behavioral health or substance abuse call? ---No Guidelines Guideline Title Affirmed Question Affirmed Notes Nurse Date/Time (Eastern Time) Smoke and Fume Inhalation Stridor or hoarseness (change in voice) [place], RN, [place] 12/21/2016 5:23:16 PM Disp. Time Eilene Ghazi Time) Disposition Final User 12/21/2016 5:37:21 PM Call EMS 911 Now Yes [place], RN, Crystal Caller Disagree/Comply Disagree Caller Understands Yes PLEASE NOTE: All timestamps contained within this report are represented as [place] Standard Time. CONFIDENTIALTY NOTICE: This fax transmission is intended only for the addressee. It contains information that is legally privileged, confidential or otherwise protected from use or disclosure. If you are not the intended recipient, you are strictly prohibited from reviewing, disclosing, copying using or disseminating any of this information or taking any action in reliance on or regarding this information. If you have received this fax in error, please notify us immediately by telephone so that we can arrange for its return to [place]. Phone: [phone], Toll-Free: [phone], Fax: [phone] Page: 2 of 2 Call Id: [id number] PreDisposition Go to ED Care Advice Given Per Guideline CARE ADVICE given per Smoke and Fume Inhalation (Adult) guideline. CALL EMS 911 NOW: Immediate medical attention is needed. You need to hang up and call 911 (or an ambulance). Psychologist, forensic Discretion: I'll call you back in a few minutes to be sure you were able to reach them.) Comments User: Willette Brace, RN Date/Time (Eastern Time): 12/21/2016 5:39:55  PM Caller states she does  not feel like she needs to call 911, asks if sore throat could be strep. Advised caller that with RN recommends calling 911, caller verbalized understanding. Referrals GO TO FACILITY UNDECIDED

## 2016-12-22 NOTE — Telephone Encounter (Signed)
SW pt and she stated that she went to [facility] on [address]. She stated that she was Dx with sinusitis.

## 2017-07-21 DIAGNOSIS — Z8739 Personal history of other diseases of the musculoskeletal system and connective tissue: Secondary | ICD-10-CM | POA: Insufficient documentation

## 2017-07-21 DIAGNOSIS — M4302 Spondylolysis, cervical region: Secondary | ICD-10-CM | POA: Insufficient documentation

## 2018-01-01 DIAGNOSIS — M5136 Other intervertebral disc degeneration, lumbar region: Secondary | ICD-10-CM | POA: Insufficient documentation

## 2018-03-05 DIAGNOSIS — M4122 Other idiopathic scoliosis, cervical region: Secondary | ICD-10-CM | POA: Diagnosis not present

## 2018-03-05 DIAGNOSIS — M545 Low back pain: Secondary | ICD-10-CM | POA: Diagnosis not present

## 2018-03-05 DIAGNOSIS — M419 Scoliosis, unspecified: Secondary | ICD-10-CM | POA: Diagnosis not present

## 2018-03-05 DIAGNOSIS — G8929 Other chronic pain: Secondary | ICD-10-CM | POA: Diagnosis not present

## 2018-03-17 DIAGNOSIS — M545 Low back pain: Secondary | ICD-10-CM | POA: Diagnosis not present

## 2018-03-17 DIAGNOSIS — M419 Scoliosis, unspecified: Secondary | ICD-10-CM | POA: Diagnosis not present

## 2018-03-17 DIAGNOSIS — G8929 Other chronic pain: Secondary | ICD-10-CM | POA: Diagnosis not present

## 2018-03-17 DIAGNOSIS — M4122 Other idiopathic scoliosis, cervical region: Secondary | ICD-10-CM | POA: Diagnosis not present

## 2018-03-26 DIAGNOSIS — Z1231 Encounter for screening mammogram for malignant neoplasm of breast: Secondary | ICD-10-CM | POA: Diagnosis not present

## 2018-03-26 DIAGNOSIS — Z01419 Encounter for gynecological examination (general) (routine) without abnormal findings: Secondary | ICD-10-CM | POA: Diagnosis not present

## 2018-03-26 DIAGNOSIS — Z130 Encounter for screening for diseases of the blood and blood-forming organs and certain disorders involving the immune mechanism: Secondary | ICD-10-CM | POA: Diagnosis not present

## 2018-03-26 DIAGNOSIS — Z1389 Encounter for screening for other disorder: Secondary | ICD-10-CM | POA: Diagnosis not present

## 2018-04-01 DIAGNOSIS — Z01419 Encounter for gynecological examination (general) (routine) without abnormal findings: Secondary | ICD-10-CM | POA: Diagnosis not present

## 2018-04-25 DIAGNOSIS — J069 Acute upper respiratory infection, unspecified: Secondary | ICD-10-CM | POA: Diagnosis not present

## 2018-04-25 DIAGNOSIS — R509 Fever, unspecified: Secondary | ICD-10-CM | POA: Diagnosis not present

## 2018-04-26 ENCOUNTER — Other Ambulatory Visit: Payer: Self-pay

## 2018-04-26 NOTE — Patient Outreach (Signed)
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 Return call placed to patient. She voices that she took nurse advice at call center and went to see MD on yesterday. Patient states she was given several meds that include antibiotic and Cortisone shot. She states that she does not feel any better or worse today. She is hopeful that the she will feel better in the next 24-48 hrs. She is aware to seek medical attention for any worsening and/or unresolved issues. Patient states that she did not get her flu shot this year as she was too busy and forgot. She plans to get it once she has recovered from this acute illness. Patient states MD told her she was febrile or contagious so she plans to try to go to work in the next day or so. She denies any further RN CM needs or concerns at this time.  Patient advised to feel free to call 24 hr Nurse Line for any future needs or concerns. They voiced understanding and was appreciative of follow up call.    Plan: RN CM will close case as no further interventions needed at this time.  Enzo Montgomery, RN,BSN,CCM [place] Management Telephonic Care Management Coordinator Direct Phone: [phone] Toll Free: [phone] Fax: [phone]

## 2018-04-26 NOTE — Patient Outreach (Signed)
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 No answer. RN CM left HIPAA compliant voicemail message along with contact info.    Plan: RN CM will make outreach attempt to patient within 3-4 business days. RN CM will send unsuccessful outreach letter to patient.   Enzo Montgomery, RN,BSN,CCM [place] Management Telephonic Care Management Coordinator Direct Phone: [phone] Toll Free: [phone] Fax: [phone]

## 2018-09-08 DIAGNOSIS — R399 Unspecified symptoms and signs involving the genitourinary system: Secondary | ICD-10-CM | POA: Diagnosis not present

## 2018-09-08 DIAGNOSIS — L237 Allergic contact dermatitis due to plants, except food: Secondary | ICD-10-CM | POA: Diagnosis not present

## 2018-09-08 DIAGNOSIS — R3915 Urgency of urination: Secondary | ICD-10-CM | POA: Diagnosis not present

## 2018-09-17 DIAGNOSIS — Z87448 Personal history of other diseases of urinary system: Secondary | ICD-10-CM | POA: Diagnosis not present

## 2018-09-17 DIAGNOSIS — G479 Sleep disorder, unspecified: Secondary | ICD-10-CM | POA: Diagnosis not present

## 2018-09-17 DIAGNOSIS — J4520 Mild intermittent asthma, uncomplicated: Secondary | ICD-10-CM | POA: Diagnosis not present

## 2018-12-24 DIAGNOSIS — M65311 Trigger thumb, right thumb: Secondary | ICD-10-CM | POA: Diagnosis not present

## 2018-12-24 DIAGNOSIS — M1811 Unilateral primary osteoarthritis of first carpometacarpal joint, right hand: Secondary | ICD-10-CM | POA: Diagnosis not present

## 2019-02-11 DIAGNOSIS — G8929 Other chronic pain: Secondary | ICD-10-CM | POA: Diagnosis not present

## 2019-02-11 DIAGNOSIS — M545 Low back pain: Secondary | ICD-10-CM | POA: Diagnosis not present

## 2019-02-11 DIAGNOSIS — M5137 Other intervertebral disc degeneration, lumbosacral region: Secondary | ICD-10-CM | POA: Diagnosis not present

## 2019-02-11 DIAGNOSIS — M47816 Spondylosis without myelopathy or radiculopathy, lumbar region: Secondary | ICD-10-CM | POA: Diagnosis not present

## 2019-03-07 DIAGNOSIS — M6281 Muscle weakness (generalized): Secondary | ICD-10-CM | POA: Diagnosis not present

## 2019-03-07 DIAGNOSIS — M5137 Other intervertebral disc degeneration, lumbosacral region: Secondary | ICD-10-CM | POA: Diagnosis not present

## 2019-03-07 DIAGNOSIS — G8929 Other chronic pain: Secondary | ICD-10-CM | POA: Diagnosis not present

## 2019-03-07 DIAGNOSIS — M419 Scoliosis, unspecified: Secondary | ICD-10-CM | POA: Diagnosis not present

## 2019-03-07 DIAGNOSIS — M4302 Spondylolysis, cervical region: Secondary | ICD-10-CM | POA: Diagnosis not present

## 2019-03-07 DIAGNOSIS — M545 Low back pain: Secondary | ICD-10-CM | POA: Diagnosis not present

## 2019-03-07 DIAGNOSIS — M4135 Thoracogenic scoliosis, thoracolumbar region: Secondary | ICD-10-CM | POA: Diagnosis not present

## 2019-03-07 DIAGNOSIS — M4122 Other idiopathic scoliosis, cervical region: Secondary | ICD-10-CM | POA: Diagnosis not present

## 2019-03-07 DIAGNOSIS — M47816 Spondylosis without myelopathy or radiculopathy, lumbar region: Secondary | ICD-10-CM | POA: Diagnosis not present

## 2019-03-08 DIAGNOSIS — G8929 Other chronic pain: Secondary | ICD-10-CM | POA: Diagnosis not present

## 2019-03-08 DIAGNOSIS — M47816 Spondylosis without myelopathy or radiculopathy, lumbar region: Secondary | ICD-10-CM | POA: Diagnosis not present

## 2019-03-08 DIAGNOSIS — M545 Low back pain: Secondary | ICD-10-CM | POA: Diagnosis not present

## 2019-03-10 DIAGNOSIS — G8929 Other chronic pain: Secondary | ICD-10-CM | POA: Diagnosis not present

## 2019-03-10 DIAGNOSIS — M4122 Other idiopathic scoliosis, cervical region: Secondary | ICD-10-CM | POA: Diagnosis not present

## 2019-03-10 DIAGNOSIS — M6281 Muscle weakness (generalized): Secondary | ICD-10-CM | POA: Diagnosis not present

## 2019-03-10 DIAGNOSIS — M419 Scoliosis, unspecified: Secondary | ICD-10-CM | POA: Diagnosis not present

## 2019-03-10 DIAGNOSIS — M4135 Thoracogenic scoliosis, thoracolumbar region: Secondary | ICD-10-CM | POA: Diagnosis not present

## 2019-03-10 DIAGNOSIS — M4302 Spondylolysis, cervical region: Secondary | ICD-10-CM | POA: Diagnosis not present

## 2019-03-10 DIAGNOSIS — M47816 Spondylosis without myelopathy or radiculopathy, lumbar region: Secondary | ICD-10-CM | POA: Diagnosis not present

## 2019-03-10 DIAGNOSIS — M5137 Other intervertebral disc degeneration, lumbosacral region: Secondary | ICD-10-CM | POA: Diagnosis not present

## 2019-03-10 DIAGNOSIS — M545 Low back pain: Secondary | ICD-10-CM | POA: Diagnosis not present

## 2019-03-17 DIAGNOSIS — M5137 Other intervertebral disc degeneration, lumbosacral region: Secondary | ICD-10-CM | POA: Diagnosis not present

## 2019-03-17 DIAGNOSIS — G8929 Other chronic pain: Secondary | ICD-10-CM | POA: Diagnosis not present

## 2019-03-17 DIAGNOSIS — M419 Scoliosis, unspecified: Secondary | ICD-10-CM | POA: Diagnosis not present

## 2019-03-17 DIAGNOSIS — M4135 Thoracogenic scoliosis, thoracolumbar region: Secondary | ICD-10-CM | POA: Diagnosis not present

## 2019-03-17 DIAGNOSIS — M545 Low back pain: Secondary | ICD-10-CM | POA: Diagnosis not present

## 2019-03-17 DIAGNOSIS — M4302 Spondylolysis, cervical region: Secondary | ICD-10-CM | POA: Diagnosis not present

## 2019-03-17 DIAGNOSIS — M4122 Other idiopathic scoliosis, cervical region: Secondary | ICD-10-CM | POA: Diagnosis not present

## 2019-03-17 DIAGNOSIS — M47816 Spondylosis without myelopathy or radiculopathy, lumbar region: Secondary | ICD-10-CM | POA: Diagnosis not present

## 2019-03-17 DIAGNOSIS — M6281 Muscle weakness (generalized): Secondary | ICD-10-CM | POA: Diagnosis not present

## 2019-04-08 DIAGNOSIS — B079 Viral wart, unspecified: Secondary | ICD-10-CM | POA: Diagnosis not present

## 2019-04-08 DIAGNOSIS — R233 Spontaneous ecchymoses: Secondary | ICD-10-CM | POA: Diagnosis not present

## 2019-04-08 DIAGNOSIS — D2271 Melanocytic nevi of right lower limb, including hip: Secondary | ICD-10-CM | POA: Diagnosis not present

## 2019-04-08 DIAGNOSIS — L570 Actinic keratosis: Secondary | ICD-10-CM | POA: Diagnosis not present

## 2019-04-29 DIAGNOSIS — B079 Viral wart, unspecified: Secondary | ICD-10-CM | POA: Diagnosis not present

## 2019-04-29 DIAGNOSIS — L918 Other hypertrophic disorders of the skin: Secondary | ICD-10-CM | POA: Diagnosis not present

## 2019-04-29 DIAGNOSIS — L821 Other seborrheic keratosis: Secondary | ICD-10-CM | POA: Diagnosis not present

## 2019-05-20 DIAGNOSIS — L570 Actinic keratosis: Secondary | ICD-10-CM | POA: Diagnosis not present

## 2019-05-20 DIAGNOSIS — L820 Inflamed seborrheic keratosis: Secondary | ICD-10-CM | POA: Diagnosis not present

## 2019-05-31 DIAGNOSIS — W19XXXA Unspecified fall, initial encounter: Secondary | ICD-10-CM | POA: Diagnosis not present

## 2019-05-31 DIAGNOSIS — M79642 Pain in left hand: Secondary | ICD-10-CM | POA: Diagnosis not present

## 2019-05-31 DIAGNOSIS — S6392XA Sprain of unspecified part of left wrist and hand, initial encounter: Secondary | ICD-10-CM | POA: Diagnosis not present

## 2019-05-31 DIAGNOSIS — S6992XA Unspecified injury of left wrist, hand and finger(s), initial encounter: Secondary | ICD-10-CM | POA: Diagnosis not present

## 2019-05-31 DIAGNOSIS — S63502A Unspecified sprain of left wrist, initial encounter: Secondary | ICD-10-CM | POA: Diagnosis not present

## 2019-06-10 DIAGNOSIS — Z0000 Encounter for general adult medical examination without abnormal findings: Secondary | ICD-10-CM | POA: Diagnosis not present

## 2019-06-10 DIAGNOSIS — Z8639 Personal history of other endocrine, nutritional and metabolic disease: Secondary | ICD-10-CM | POA: Diagnosis not present

## 2019-06-10 DIAGNOSIS — M19042 Primary osteoarthritis, left hand: Secondary | ICD-10-CM | POA: Diagnosis not present

## 2019-06-10 DIAGNOSIS — E7800 Pure hypercholesterolemia, unspecified: Secondary | ICD-10-CM | POA: Diagnosis not present

## 2019-06-10 DIAGNOSIS — M19041 Primary osteoarthritis, right hand: Secondary | ICD-10-CM | POA: Diagnosis not present

## 2019-06-10 DIAGNOSIS — Z111 Encounter for screening for respiratory tuberculosis: Secondary | ICD-10-CM | POA: Diagnosis not present

## 2019-06-24 DIAGNOSIS — L237 Allergic contact dermatitis due to plants, except food: Secondary | ICD-10-CM | POA: Diagnosis not present

## 2019-06-24 DIAGNOSIS — B079 Viral wart, unspecified: Secondary | ICD-10-CM | POA: Diagnosis not present

## 2019-07-15 DIAGNOSIS — L299 Pruritus, unspecified: Secondary | ICD-10-CM | POA: Diagnosis not present

## 2019-07-15 DIAGNOSIS — L219 Seborrheic dermatitis, unspecified: Secondary | ICD-10-CM | POA: Diagnosis not present

## 2019-07-15 DIAGNOSIS — L570 Actinic keratosis: Secondary | ICD-10-CM | POA: Diagnosis not present

## 2019-07-15 DIAGNOSIS — D485 Neoplasm of uncertain behavior of skin: Secondary | ICD-10-CM | POA: Diagnosis not present

## 2019-09-23 DIAGNOSIS — L570 Actinic keratosis: Secondary | ICD-10-CM | POA: Diagnosis not present

## 2019-09-23 DIAGNOSIS — L728 Other follicular cysts of the skin and subcutaneous tissue: Secondary | ICD-10-CM | POA: Diagnosis not present

## 2019-09-23 DIAGNOSIS — L209 Atopic dermatitis, unspecified: Secondary | ICD-10-CM | POA: Diagnosis not present

## 2019-11-14 DIAGNOSIS — R0981 Nasal congestion: Secondary | ICD-10-CM | POA: Diagnosis not present

## 2019-11-14 DIAGNOSIS — U071 COVID-19: Secondary | ICD-10-CM | POA: Diagnosis not present

## 2019-11-18 ENCOUNTER — Inpatient Hospital Stay (HOSPITAL_BASED_OUTPATIENT_CLINIC_OR_DEPARTMENT_OTHER)
Admission: EM | Admit: 2019-11-18 | Discharge: 2019-12-01 | DRG: 871 | Disposition: A | Discharge status: Home Health | Payer: PPO | Attending: Internal Medicine | Admitting: Internal Medicine

## 2019-11-18 ENCOUNTER — Encounter (HOSPITAL_BASED_OUTPATIENT_CLINIC_OR_DEPARTMENT_OTHER): Payer: Self-pay | Admitting: *Deleted

## 2019-11-18 ENCOUNTER — Emergency Department (HOSPITAL_BASED_OUTPATIENT_CLINIC_OR_DEPARTMENT_OTHER): Payer: PPO

## 2019-11-18 ENCOUNTER — Other Ambulatory Visit: Payer: Self-pay

## 2019-11-18 DIAGNOSIS — J1282 Pneumonia due to coronavirus disease 2019: Secondary | ICD-10-CM

## 2019-11-18 DIAGNOSIS — R0600 Dyspnea, unspecified: Secondary | ICD-10-CM | POA: Diagnosis not present

## 2019-11-18 DIAGNOSIS — K219 Gastro-esophageal reflux disease without esophagitis: Secondary | ICD-10-CM | POA: Diagnosis not present

## 2019-11-18 DIAGNOSIS — Z882 Allergy status to sulfonamides status: Secondary | ICD-10-CM

## 2019-11-18 DIAGNOSIS — Z8371 Family history of colonic polyps: Secondary | ICD-10-CM

## 2019-11-18 DIAGNOSIS — R279 Unspecified lack of coordination: Secondary | ICD-10-CM | POA: Diagnosis not present

## 2019-11-18 DIAGNOSIS — J9601 Acute respiratory failure with hypoxia: Secondary | ICD-10-CM | POA: Diagnosis not present

## 2019-11-18 DIAGNOSIS — E1165 Type 2 diabetes mellitus with hyperglycemia: Secondary | ICD-10-CM | POA: Diagnosis not present

## 2019-11-18 DIAGNOSIS — Z888 Allergy status to other drugs, medicaments and biological substances status: Secondary | ICD-10-CM | POA: Diagnosis not present

## 2019-11-18 DIAGNOSIS — F329 Major depressive disorder, single episode, unspecified: Secondary | ICD-10-CM | POA: Diagnosis present

## 2019-11-18 DIAGNOSIS — R0902 Hypoxemia: Secondary | ICD-10-CM

## 2019-11-18 DIAGNOSIS — Z743 Need for continuous supervision: Secondary | ICD-10-CM | POA: Diagnosis not present

## 2019-11-18 DIAGNOSIS — Z91040 Latex allergy status: Secondary | ICD-10-CM | POA: Diagnosis not present

## 2019-11-18 DIAGNOSIS — R0602 Shortness of breath: Secondary | ICD-10-CM | POA: Diagnosis not present

## 2019-11-18 DIAGNOSIS — E119 Type 2 diabetes mellitus without complications: Secondary | ICD-10-CM

## 2019-11-18 DIAGNOSIS — J329 Chronic sinusitis, unspecified: Secondary | ICD-10-CM

## 2019-11-18 DIAGNOSIS — U071 COVID-19: Secondary | ICD-10-CM | POA: Diagnosis present

## 2019-11-18 DIAGNOSIS — Z7984 Long term (current) use of oral hypoglycemic drugs: Secondary | ICD-10-CM

## 2019-11-18 DIAGNOSIS — Z8719 Personal history of other diseases of the digestive system: Secondary | ICD-10-CM | POA: Diagnosis not present

## 2019-11-18 DIAGNOSIS — K5900 Constipation, unspecified: Secondary | ICD-10-CM

## 2019-11-18 DIAGNOSIS — A4189 Other specified sepsis: Secondary | ICD-10-CM | POA: Diagnosis not present

## 2019-11-18 DIAGNOSIS — J9600 Acute respiratory failure, unspecified whether with hypoxia or hypercapnia: Secondary | ICD-10-CM | POA: Diagnosis not present

## 2019-11-18 DIAGNOSIS — J3489 Other specified disorders of nose and nasal sinuses: Secondary | ICD-10-CM | POA: Diagnosis not present

## 2019-11-18 DIAGNOSIS — J4530 Mild persistent asthma, uncomplicated: Secondary | ICD-10-CM | POA: Diagnosis not present

## 2019-11-18 DIAGNOSIS — Z833 Family history of diabetes mellitus: Secondary | ICD-10-CM | POA: Diagnosis not present

## 2019-11-18 DIAGNOSIS — J320 Chronic maxillary sinusitis: Secondary | ICD-10-CM | POA: Diagnosis not present

## 2019-11-18 DIAGNOSIS — F419 Anxiety disorder, unspecified: Secondary | ICD-10-CM | POA: Diagnosis not present

## 2019-11-18 DIAGNOSIS — Z8249 Family history of ischemic heart disease and other diseases of the circulatory system: Secondary | ICD-10-CM | POA: Diagnosis not present

## 2019-11-18 DIAGNOSIS — R0781 Pleurodynia: Secondary | ICD-10-CM

## 2019-11-18 DIAGNOSIS — J189 Pneumonia, unspecified organism: Secondary | ICD-10-CM | POA: Diagnosis not present

## 2019-11-18 DIAGNOSIS — Z79899 Other long term (current) drug therapy: Secondary | ICD-10-CM | POA: Diagnosis not present

## 2019-11-18 DIAGNOSIS — R7989 Other specified abnormal findings of blood chemistry: Secondary | ICD-10-CM | POA: Diagnosis not present

## 2019-11-18 DIAGNOSIS — J309 Allergic rhinitis, unspecified: Secondary | ICD-10-CM

## 2019-11-18 DIAGNOSIS — J45909 Unspecified asthma, uncomplicated: Secondary | ICD-10-CM | POA: Diagnosis not present

## 2019-11-18 LAB — CBC WITH DIFFERENTIAL/PLATELET
Abs Immature Granulocytes: 0.01 10*3/uL (ref 0.00–0.07)
Basophils Absolute: 0 10*3/uL (ref 0.0–0.1)
Basophils Relative: 0 %
Eosinophils Absolute: 0 10*3/uL (ref 0.0–0.5)
Eosinophils Relative: 0 %
HCT: 38.3 % (ref 36.0–46.0)
Hemoglobin: 13 g/dL (ref 12.0–15.0)
Immature Granulocytes: 0 %
Lymphocytes Relative: 17 %
Lymphs Abs: 0.5 10*3/uL — ABNORMAL LOW (ref 0.7–4.0)
MCH: 30.9 pg (ref 26.0–34.0)
MCHC: 33.9 g/dL (ref 30.0–36.0)
MCV: 91 fL (ref 80.0–100.0)
Monocytes Absolute: 0.2 10*3/uL (ref 0.1–1.0)
Monocytes Relative: 6 %
Neutro Abs: 2.3 10*3/uL (ref 1.7–7.7)
Neutrophils Relative %: 77 %
Platelets: 190 10*3/uL (ref 150–400)
RBC: 4.21 MIL/uL (ref 3.87–5.11)
RDW: 12.9 % (ref 11.5–15.5)
WBC: 3 10*3/uL — ABNORMAL LOW (ref 4.0–10.5)
nRBC: 0 % (ref 0.0–0.2)

## 2019-11-18 LAB — COMPREHENSIVE METABOLIC PANEL
ALT: 41 U/L (ref 0–44)
AST: 57 U/L — ABNORMAL HIGH (ref 15–41)
Albumin: 3.1 g/dL — ABNORMAL LOW (ref 3.5–5.0)
Alkaline Phosphatase: 42 U/L (ref 38–126)
Anion gap: 10 (ref 5–15)
BUN: 12 mg/dL (ref 8–23)
CO2: 23 mmol/L (ref 22–32)
Calcium: 8.1 mg/dL — ABNORMAL LOW (ref 8.9–10.3)
Chloride: 100 mmol/L (ref 98–111)
Creatinine, Ser: 0.66 mg/dL (ref 0.44–1.00)
GFR calc Af Amer: >60 mL/min (ref >60)
GFR calc non Af Amer: >60 mL/min (ref >60)
Glucose, Bld: 210 mg/dL — ABNORMAL HIGH (ref 70–99)
Potassium: 4 mmol/L (ref 3.5–5.1)
Sodium: 133 mmol/L — ABNORMAL LOW (ref 135–145)
Total Bilirubin: 0.4 mg/dL (ref 0.3–1.2)
Total Protein: 6.4 g/dL — ABNORMAL LOW (ref 6.5–8.1)

## 2019-11-18 LAB — HIV ANTIBODY (ROUTINE TESTING W REFLEX): HIV Screen 4th Generation wRfx: NONREACTIVE

## 2019-11-18 LAB — TRIGLYCERIDES: Triglycerides: 63 mg/dL (ref <150)

## 2019-11-18 LAB — FERRITIN: Ferritin: 231 ng/mL (ref 11–307)

## 2019-11-18 LAB — D-DIMER, QUANTITATIVE: D-Dimer, Quant: 0.39 ug/mL-FEU (ref 0.00–0.50)

## 2019-11-18 LAB — LACTIC ACID, PLASMA
Lactic Acid, Venous: 2.1 mmol/L (ref 0.5–1.9)
Lactic Acid, Venous: 2 mmol/L (ref 0.5–1.9)

## 2019-11-18 LAB — FIBRINOGEN: Fibrinogen: 428 mg/dL (ref 210–475)

## 2019-11-18 LAB — PROCALCITONIN: Procalcitonin: <0.1 ng/mL

## 2019-11-18 LAB — C-REACTIVE PROTEIN: CRP: 6.8 mg/dL — ABNORMAL HIGH (ref <1.0)

## 2019-11-18 LAB — LACTATE DEHYDROGENASE: LDH: 382 U/L — ABNORMAL HIGH (ref 98–192)

## 2019-11-18 MED ORDER — SODIUM CHLORIDE 0.9 % IV SOLN
INTRAVENOUS | Status: DC | PRN
  Administered 2019-11-18: 500 mL via INTRAVENOUS

## 2019-11-18 MED ORDER — LACTATED RINGERS IV SOLN: INTRAVENOUS | Status: DC

## 2019-11-18 MED ORDER — TRAZODONE HCL 50 MG PO TABS
25.0000 mg | ORAL_TABLET | Freq: Every evening | ORAL | Status: DC | PRN
  Administered 2019-11-19 – 2019-11-23 (×4): 25 mg via ORAL
  Filled 2019-11-18 (×5): qty 1

## 2019-11-18 MED ORDER — ENOXAPARIN SODIUM 40 MG/0.4ML ~~LOC~~ SOLN
40.0000 mg | SUBCUTANEOUS | Status: DC
  Administered 2019-11-18 – 2019-11-30 (×13): 40 mg via SUBCUTANEOUS
  Filled 2019-11-18 (×14): qty 0.4

## 2019-11-18 MED ORDER — METHYLPREDNISOLONE SODIUM SUCC 125 MG IJ SOLR
1.0000 mg/kg | Freq: Two times a day (BID) | INTRAMUSCULAR | Status: AC
  Administered 2019-11-18 – 2019-11-21 (×6): 75 mg via INTRAVENOUS
  Filled 2019-11-18 (×6): qty 2

## 2019-11-18 MED ORDER — SODIUM CHLORIDE 0.9 % IV SOLN
100.0000 mg | Freq: Every day | INTRAVENOUS | Status: AC
  Administered 2019-11-19 – 2019-11-22 (×4): 100 mg via INTRAVENOUS
  Filled 2019-11-18 (×5): qty 20

## 2019-11-18 MED ORDER — HYDROCOD POLST-CPM POLST ER 10-8 MG/5ML PO SUER
5.0000 mL | Freq: Two times a day (BID) | ORAL | Status: DC | PRN
  Administered 2019-11-21 – 2019-11-30 (×6): 5 mL via ORAL
  Filled 2019-11-18 (×6): qty 5

## 2019-11-18 MED ORDER — VITAMIN D 25 MCG (1000 UNIT) PO TABS
1000.0000 [IU] | ORAL_TABLET | Freq: Every day | ORAL | Status: DC
  Administered 2019-11-18 – 2019-12-01 (×14): 1000 [IU] via ORAL
  Filled 2019-11-18 (×14): qty 1

## 2019-11-18 MED ORDER — ONDANSETRON HCL 4 MG/2ML IJ SOLN: 4.0000 mg | Freq: Four times a day (QID) | INTRAMUSCULAR | Status: DC | PRN

## 2019-11-18 MED ORDER — METFORMIN HCL ER 500 MG PO TB24
500.0000 mg | ORAL_TABLET | Freq: Two times a day (BID) | ORAL | Status: DC
  Administered 2019-11-19 – 2019-11-22 (×7): 500 mg via ORAL
  Filled 2019-11-18 (×8): qty 1

## 2019-11-18 MED ORDER — ACETAMINOPHEN 325 MG PO TABS
650.0000 mg | ORAL_TABLET | Freq: Four times a day (QID) | ORAL | Status: DC | PRN
  Administered 2019-11-19 – 2019-11-25 (×2): 650 mg via ORAL
  Filled 2019-11-18 (×3): qty 2

## 2019-11-18 MED ORDER — PREDNISONE 20 MG PO TABS
50.0000 mg | ORAL_TABLET | Freq: Every day | ORAL | Status: DC
  Administered 2019-11-22 – 2019-11-23 (×2): 50 mg via ORAL
  Filled 2019-11-18 (×2): qty 2

## 2019-11-18 MED ORDER — POLYETHYLENE GLYCOL 3350 17 G PO PACK
17.0000 g | PACK | Freq: Every day | ORAL | Status: DC | PRN
  Administered 2019-11-20 – 2019-11-30 (×3): 17 g via ORAL
  Filled 2019-11-18 (×3): qty 1

## 2019-11-18 MED ORDER — GUAIFENESIN-DM 100-10 MG/5ML PO SYRP
10.0000 mL | ORAL_SOLUTION | ORAL | Status: DC | PRN
  Administered 2019-11-18 – 2019-12-01 (×15): 10 mL via ORAL
  Filled 2019-11-18 (×14): qty 10

## 2019-11-18 MED ORDER — DEXAMETHASONE SODIUM PHOSPHATE 10 MG/ML IJ SOLN
6.0000 mg | INTRAMUSCULAR | Status: DC
  Administered 2019-11-18: 6 mg via INTRAVENOUS
  Filled 2019-11-18: qty 1

## 2019-11-18 MED ORDER — OMEPRAZOLE 20 MG PO CPDR
20.0000 mg | DELAYED_RELEASE_CAPSULE | Freq: Every day | ORAL | Status: DC | PRN
  Administered 2019-11-20 – 2019-11-25 (×5): 20 mg via ORAL
  Filled 2019-11-18 (×8): qty 1

## 2019-11-18 MED ORDER — ZINC SULFATE 220 (50 ZN) MG PO CAPS
ORAL_CAPSULE | Freq: Every day | ORAL | Status: DC
  Administered 2019-11-18 – 2019-11-19 (×2): 220 mg via ORAL
  Filled 2019-11-18 (×2): qty 1

## 2019-11-18 MED ORDER — ALBUTEROL SULFATE HFA 108 (90 BASE) MCG/ACT IN AERS
2.0000 | INHALATION_SPRAY | RESPIRATORY_TRACT | Status: DC | PRN
  Filled 2019-11-18: qty 6.7

## 2019-11-18 MED ORDER — EMERGEN-C IMMUNE PO PACK: PACK | ORAL | Status: DC

## 2019-11-18 MED ORDER — TRAMADOL HCL 50 MG PO TABS
50.0000 mg | ORAL_TABLET | Freq: Four times a day (QID) | ORAL | Status: DC | PRN
  Administered 2019-11-20: 50 mg via ORAL
  Filled 2019-11-18 (×2): qty 1

## 2019-11-18 MED ORDER — SODIUM CHLORIDE 0.9 % IV SOLN: 100.0000 mg | Freq: Every day | INTRAVENOUS | Status: DC

## 2019-11-18 MED ORDER — SODIUM CHLORIDE 0.9 % IV SOLN
INTRAVENOUS | Status: DC
  Administered 2019-11-18: 125 mL/h via INTRAVENOUS

## 2019-11-18 MED ORDER — INSULIN ASPART 100 UNIT/ML ~~LOC~~ SOLN
0.0000 [IU] | Freq: Three times a day (TID) | SUBCUTANEOUS | Status: DC
  Administered 2019-11-19 – 2019-11-21 (×9): 3 [IU] via SUBCUTANEOUS
  Administered 2019-11-22: 8 [IU] via SUBCUTANEOUS
  Administered 2019-11-23: 5 [IU] via SUBCUTANEOUS
  Administered 2019-11-23: 3 [IU] via SUBCUTANEOUS
  Administered 2019-11-24: 8 [IU] via SUBCUTANEOUS
  Administered 2019-11-24 (×2): 3 [IU] via SUBCUTANEOUS
  Administered 2019-11-25: 5 [IU] via SUBCUTANEOUS
  Administered 2019-11-25: 8 [IU] via SUBCUTANEOUS
  Administered 2019-11-25: 3 [IU] via SUBCUTANEOUS
  Administered 2019-11-26: 5 [IU] via SUBCUTANEOUS
  Administered 2019-11-26: 3 [IU] via SUBCUTANEOUS
  Administered 2019-11-26 – 2019-11-27 (×2): 8 [IU] via SUBCUTANEOUS
  Administered 2019-11-27: 5 [IU] via SUBCUTANEOUS
  Administered 2019-11-27 – 2019-11-28 (×3): 3 [IU] via SUBCUTANEOUS
  Administered 2019-11-28: 5 [IU] via SUBCUTANEOUS
  Administered 2019-11-29: 8 [IU] via SUBCUTANEOUS
  Administered 2019-11-29: 0 [IU] via SUBCUTANEOUS
  Administered 2019-11-30: 5 [IU] via SUBCUTANEOUS
  Administered 2019-11-30: 2 [IU] via SUBCUTANEOUS
  Administered 2019-11-30: 3 [IU] via SUBCUTANEOUS
  Administered 2019-12-01 (×2): 2 [IU] via SUBCUTANEOUS

## 2019-11-18 MED ORDER — FLUTICASONE PROPIONATE 50 MCG/ACT NA SUSP
1.0000 | Freq: Every day | NASAL | Status: DC | PRN
  Administered 2019-11-23 – 2019-11-28 (×3): 1 via NASAL
  Filled 2019-11-18 (×2): qty 16

## 2019-11-18 MED ORDER — SODIUM CHLORIDE 0.9 % IV SOLN
200.0000 mg | Freq: Once | INTRAVENOUS | Status: DC
  Filled 2019-11-18: qty 40

## 2019-11-18 MED ORDER — ONDANSETRON HCL 4 MG PO TABS: 4.0000 mg | ORAL_TABLET | Freq: Four times a day (QID) | ORAL | Status: DC | PRN

## 2019-11-18 MED ORDER — SODIUM CHLORIDE 0.9 % IV SOLN
100.0000 mg | INTRAVENOUS | Status: AC
  Administered 2019-11-18 (×2): 100 mg via INTRAVENOUS
  Filled 2019-11-18: qty 20

## 2019-11-18 MED ORDER — SALINE SPRAY 0.65 % NA SOLN
2.0000 | Freq: Two times a day (BID) | NASAL | Status: DC | PRN
  Administered 2019-11-20 – 2019-11-24 (×5): 2 via NASAL
  Filled 2019-11-18 (×2): qty 44

## 2019-11-18 MED ORDER — ALBUTEROL SULFATE HFA 108 (90 BASE) MCG/ACT IN AERS
8.0000 | INHALATION_SPRAY | Freq: Once | RESPIRATORY_TRACT | Status: AC
  Administered 2019-11-18: 8 via RESPIRATORY_TRACT
  Filled 2019-11-18: qty 6.7

## 2019-11-18 MED ORDER — CALCIUM CARBONATE 1250 (500 CA) MG PO TABS
1.0000 | ORAL_TABLET | Freq: Every day | ORAL | Status: DC
  Administered 2019-11-19 – 2019-12-01 (×13): 500 mg via ORAL
  Filled 2019-11-18 (×13): qty 1

## 2019-11-18 NOTE — ED Notes (Signed)
Called to give report  RN unable to take report

## 2019-11-18 NOTE — ED Notes (Signed)
Ambulated to triage from lobby SpO2 88% on r/a, RR 20, BBS fine crackles t/o. Congested cough.

## 2019-11-18 NOTE — ED Triage Notes (Signed)
Covid Positive. Here with c.o SOB x 5 days.

## 2019-11-18 NOTE — H&P (Signed)
History and Physical    [name] [name]:[id number] DOB: [birth year] DOA: 11/18/2019  PCP: Ardith Dark, PA-C  Patient coming from: Home  I have personally briefly reviewed patient's old medical records in [place]  Chief Complaint: Shortness of breath  HPI: [name] [name] is a 69 y.o. female with medical history significant of asthma, type 2 diabetes, GERD, allergies with known Covid exposure and positive test on September 13.  She reports she has had symptoms for approximately 10 days.  She has history of asthma has an inhaler and has been using that without significant improvement.  In addition to her shortness of breath she reports body aches, cough, sore throat, congestion.  The patient has been self-medicating at home with vitamin C, vitamin D, zinc, and ivermectin that she bought at the store.  ED Course: Patient presented to the med Center in [facility] and was noted to have hypoxia, chest x-ray that revealed Covid pneumonia, and lactic acid of 2.0, C-reactive protein of 6.8, normal fibrinogen, normal triglycerides, normal ferritin, LDH of 382, procalcitonin of less than 0.1, D-dimer of 0.39.  She was noted to have a CBG of 210.  She also had a low white count of 3.0.  Review of Systems: As per HPI otherwise 10 point review of systems negative.   Past Medical History:  Diagnosis Date  . Allergic rhinitis   . Anxiety and depression   . Arthritis    neck  . Bone spur    neck  . Cataract    L. eye  . Chronic sinusitis    CT sinuses 2012, moderately severe.  . Diplopia   . Diverticulosis    patient denies  . GERD (gastroesophageal reflux disease)   . Herniated lumbar intervertebral disc   . Hypoglycemia   . Ligament tear of lower extremity    L. foot Dr. Wyman Songster  . Lumbago    and radiculopathy  . Mild persistent asthma   . Sessile colonic polyp 2007   42m    Past Surgical History:  Procedure Laterality Date  . ANTERIOR (CYSTOCELE) AND POSTERIOR  REPAIR (RECTOCELE) WITH XENFORM GRAFT AND SACROSPINOUS FIXATION N/A 12/25/2014   Procedure: ANTERIOR (CYSTOCELE) AND POSTERIOR REPAIR (RECTOCELE) ;  Surgeon: TNewton Pigg MD;  [facility];  Service: Gynecology;  Laterality: N/A;  . COLONOSCOPY W/ POLYPECTOMY  11/2005   INCOMPLETE - to transverse colon  - diverticulosis, 459mpolyp was hyperplastic  . FOOT SURGERY     left - Dr. BeBeola Cord009  . LAPAROSCOPIC SALPINGOOPHERECTOMY  05/05/2006   LSO for complex left adnexal mass:  PATH--serous cystadenofibroma  . SHOULDER SURGERY     impingement syndrome  . TUBAL LIGATION    . UPPER GASTROINTESTINAL ENDOSCOPY  06/2002,    with dilation, esophageal stenosis  . VAGINAL HYSTERECTOMY N/A 12/25/2014   Procedure: HYSTERECTOMY VAGINAL;  Surgeon: ThNewton PiggMD;  [facility];  Service: Gynecology;  Laterality: N/A;     reports that she has never smoked. She has never used smokeless tobacco. She reports that she does not drink alcohol and does not use drugs.  Patient lives with her husband RiDelfino Lovettho is her healthcare power of attorney.  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  Allergies  Allergen Reactions  . Ciprofloxacin Other (See Comments)    Pt. Reports a tendon issue s/p cipro  . Latex Itching  . Other Other (See Comments)    Bloating stomach  . Sulfonamide Derivatives Other (  See Comments)    Bloating stomach    Family History  Problem Relation Age of Onset  . Heart disease Mother 2       CHF with valvular dz  . Multiple myeloma Mother   . Colon polyps Mother   . Diabetes Mother   . Cancer Mother        multiple myel  . Breast cancer Other        cousin  . Lung cancer Other        uncle  . Heart disease Brother        one with aortic valve dz and one with CAD  . Colon cancer Neg Hx      Prior to Admission medications   Medication Sig Start Date End Date Taking? Authorizing Provider  albuterol (PROAIR HFA) 108 (90 BASE) MCG/ACT inhaler Inhale 2  puffs into the lungs every 4 (four) hours as needed. Patient taking differently: Inhale 2 puffs into the lungs every 4 (four) hours as needed for wheezing or shortness of breath.  07/06/14  Yes McGowen, Adrian Blackwater, MD  benzonatate (TESSALON) 200 MG capsule Take 200 mg by mouth 3 (three) times daily as needed for cough.  11/16/19 11/23/19 Yes [provider]  calcium carbonate (OS-CAL) 600 MG TABS Take 600 mg by mouth daily.   Yes [provider]  Cholecalciferol (VITAMIN D3) 1000 UNITS tablet Take 1,000 Units by mouth daily.     Yes [provider]  fish oil-omega-3 fatty acids 1000 MG capsule Take 1 g by mouth daily.    Yes [provider]  fluticasone (FLONASE) 50 MCG/ACT nasal spray Place 1 spray into both nostrils daily as needed for allergies.    Yes [provider]  ivermectin (STROMECTOL) 3 MG TABS tablet Take 3 mg by mouth once.   Yes [provider]  metFORMIN (GLUCOPHAGE-XR) 500 MG 24 hr tablet Take 500 mg by mouth in the morning and at bedtime.  07/07/19  Yes [provider]  Multiple Vitamin (MULTIVITAMIN) tablet Take 1 tablet by mouth daily.   Yes [provider]  Multiple Vitamins-Minerals (EMERGEN-C IMMUNE PO) Take 1,000-2,000 mg by mouth See admin instructions. Take 2031m in the morning, 20065min the afternoon and 100055mt night   Yes [provider]  Multiple Vitamins-Minerals (ZINC PO) Take 1 capsule by mouth daily.   Yes [provider]  naproxen sodium (ALEVE) 220 MG tablet Take 220 mg by mouth 2 (two) times daily as needed (pain).   Yes [provider]  omeprazole (PRILOSEC OTC) 20 MG tablet Take 20 mg by mouth daily as needed (for heartburn).    Yes [provider]  OVER THE COUNTER MEDICATION Take 1 capsule by mouth daily. "Quercertin"   Yes [provider]  SALINE NA Place 1 packet into the nose 2 (two) times daily as needed (dry nose).   Yes [provider]      Physical Exam: Vitals:   11/18/19 1700 11/18/19 1805 11/18/19 1900 11/18/19 2014  BP: 106/71  109/73 117/75  Pulse: 93  96 96  Resp: _0 Temp: 99.3 F (37.4 C) 99.3 F (37.4 C)  99 F (37.2 C)  TempSrc: Oral Oral    SpO2: 95%  95% 94%  Weight:        Constitutional: NAD, calm, comfortable Eyes: PERRL, lids and conjunctivae normal ENMT: Mucous membranes are moist. Posterior pharynx clear of any exudate or lesions.Normal dentition.  Neck:  normal, supple, no masses, no thyromegaly Respiratory: Coarse rhonchi bilaterally, no wheezing, no crackles.  No accessory muscle use.  Cardiovascular: Tachy rate and rhythm, no murmurs / rubs / gallops. No extremity edema. 2+ pedal pulses. No carotid bruits.  Abdomen: no tenderness, no masses palpated. No hepatosplenomegaly. Bowel sounds positive.  Musculoskeletal: no clubbing / cyanosis. No joint deformity upper and lower extremities. Good ROM, no contractures. Normal muscle tone.  Skin: no rashes, lesions, ulcers. No induration Neurologic: CN 2-12 grossly intact. Sensation intact, DTR normal. Strength 5/5 in all 4.  Psychiatric: Normal judgment and insight. Alert and oriented x 3. Normal mood.   Labs on Admission: I have personally reviewed following labs and imaging studies  CBC: Recent Labs  Lab 11/18/19 1225  WBC 3.0*  NEUTROABS 2.3  HGB 13.0  HCT 38.3  MCV 91.0  PLT 025   Basic Metabolic Panel: Recent Labs  Lab 11/18/19 1225  NA 133*  K 4.0  CL 100  CO2 23  GLUCOSE 210*  BUN 12  CREATININE 0.66  CALCIUM 8.1*   GFR: CrCl cannot be calculated (Unknown ideal weight.). Liver Function Tests: Recent Labs  Lab 11/18/19 1225  AST 57*  ALT 41  ALKPHOS 42  BILITOT 0.4  PROT 6.4*  ALBUMIN 3.1*   Lipid Profile: Recent Labs    11/18/19 1225  TRIG 63   Anemia Panel: Recent Labs    11/18/19 1225  FERRITIN 231   Urine analysis:    Component Value Date/Time   COLORURINE LT YELLOW 09/27/2007 0822    APPEARANCEUR Clear 09/27/2007 0822   LABSPEC 1.020 09/27/2007 0822   PHURINE 6.0 09/27/2007 0822   GLUCOSEU NEGATIVE 09/27/2007 0822   BILIRUBINUR NEGATIVE 09/27/2007 0822   KETONESUR NEGATIVE 09/27/2007 0822   UROBILINOGEN 0.2 mg/dL 09/27/2007 0822   NITRITE Negative 09/27/2007 0822   LEUKOCYTESUR Negative 09/27/2007 0822    Radiological Exams on Admission: DG Chest Port 1 View  Result Date: 11/18/2019 CLINICAL DATA:  COVID, dyspnea EXAM: PORTABLE CHEST 1 VIEW COMPARISON:  11/19/2010 chest radiograph. FINDINGS: Stable cardiomediastinal silhouette with normal heart size. No pneumothorax. No pleural effusion. Hazy patchy mid and lower right lung opacities, new. IMPRESSION: New hazy patchy mid and lower right lung opacities, suspicious for pneumonia. Electronically Signed   By: Ilona Sorrel M.D.   On: 11/18/2019 12:40    EKG: Independently reviewed.  Sinus tachycardia, abnormal R wave progression  Assessment/Plan Principal Problem:   Acute hypoxemic respiratory failure due to COVID-19 [facility]) Active Problems:   Allergic rhinitis   GERD   Pneumonia due to COVID-19 virus   Type 2 diabetes mellitus (HCC)  Acute hypoxic respiratory failure due to COVID-19 Continue oxygen to maintain oxygen saturations above 92.  COVID-19 positive test (U07.1, COVID-19) with Acute Pneumonia  Remdesivir Steroids Daily labs Continue vitamin C, vitamin D, zinc Lovenox Guaifenesin, Tussionex for cough Supportive care  Asthma Albuterol as needed  GERD Continue Prilosec  Allergic rhinitis  Type 2 diabetes Continue Metformin Sliding scale insulin Check A1c   DVT prophylaxis: Lovenox SQ Code Status: Full code  Family Communication: Patient and husband (via phone).  The patient reports vaccine hesitancy related to the use of abortive fetuses and the development of vaccines.  The patient's husband is quite firm in his believe that the patient should receive ivermectin.  I reviewed this with  pharmacy, we do not carry ivermectin.  Ivermectin she has been taking at home she is gotten from a store and is unclear that this is  intended for human use.  She has also been using it for several days without much improvement.  I have reviewed the literature that we have now that does not recommend ivermectin use for COVID-19.  Husband is also quite concerned about the use of remdesivir, although after explaining to him how it worked he seemed to be okay with that.  Discussed of her oxygen needs increase she might also need be a candidate for for baricitinib Disposition Plan: home  Consults called: none Admission status: Inpatient  Inpatient status is appropriate due to ongoing oxygen needs, IV therapies.   Donnamae Jude MD [facility]  If 7PM-7AM, please contact night-coverage 11/18/2019, 11:26 PM

## 2019-11-18 NOTE — Progress Notes (Signed)
Patient was received from [facility].  Alert and oriented X4, Admitted  with SOB, Sats 94 on 02 at 2L, BP 117/75, Pulse 90, Temp 99 Resp 20.  She also has a cough.  Patient was also placed on tele.  Patient received meds as ordered and was also seen by Admission MD.  Patient HCPOA (husband) spoke with MD expressing that they want ivermectin and they also want antibiotic treatment for pneumonia.

## 2019-11-18 NOTE — ED Provider Notes (Signed)
[facility] EMERGENCY DEPARTMENT Provider Note   CSN: [id number] Arrival date & time: 11/18/19  1140     History Chief Complaint  Patient presents with  . Shortness of Breath  . Covid Positive    [name] [name] is a 69 y.o. female.  69 year old female presents with shortness of breath.  Patient had a virtual visit on September 13 when she had a positive Covid test.  States symptoms started about 5 days before that.  Patient has a history of asthma, has been using her regular inhaler treatments with limited improvement.  Reports cough, body aches, sore throat, sinus congestion, SHOB.  [name] [name] was evaluated in Emergency Department on 11/18/2019 for the symptoms described in the history of present illness. She was evaluated in the context of the global COVID-19 pandemic, which necessitated consideration that the patient might be at risk for infection with the SARS-CoV-2 virus that causes COVID-19. Institutional protocols and algorithms that pertain to the evaluation of patients at risk for COVID-19 are in a state of rapid change based on information released by regulatory bodies including the CDC and federal and state organizations. These policies and algorithms were followed during the patient's care in the ED.         Past Medical History:  Diagnosis Date  . Allergic rhinitis   . Anxiety and depression   . Arthritis    neck  . Bone spur    neck  . Cataract    L. eye  . Chronic sinusitis    CT sinuses 2012, moderately severe.  . Diplopia   . Diverticulosis    patient denies  . GERD (gastroesophageal reflux disease)   . Herniated lumbar intervertebral disc   . Hypoglycemia   . Ligament tear of lower extremity    L. foot Dr. Wyman Songster  . Lumbago    and radiculopathy  . Mild persistent asthma   . Sessile colonic polyp 2007   41m    Patient Active Problem List   Diagnosis Date Noted  . Acute hypoxemic respiratory failure due to COVID-19 ([place]  11/18/2019  . S/P vaginal hysterectomy 12/25/2014  . Elevated ALT measurement 02/08/2012  . Insomnia 10/15/2011  . Carpal tunnel syndrome 10/15/2011  . Weight gain, abnormal 10/15/2011  . Health maintenance examination 10/15/2011  . Special screening for malignant neoplasms, colon 10/09/2010  . Personal history of failed moderate sedation 10/09/2010  . Unspecified asthma(493.90) 04/18/2008  . ALLERGIC RHINITIS 09/30/2007  . GERD 09/30/2007  . LOW BACK PAIN 09/30/2007    Past Surgical History:  Procedure Laterality Date  . ANTERIOR (CYSTOCELE) AND POSTERIOR REPAIR (RECTOCELE) WITH XENFORM GRAFT AND SACROSPINOUS FIXATION N/A 12/25/2014   Procedure: ANTERIOR (CYSTOCELE) AND POSTERIOR REPAIR (RECTOCELE) ;  Surgeon: TNewton Pigg MD;  [facility];  Service: Gynecology;  Laterality: N/A;  . COLONOSCOPY W/ POLYPECTOMY  11/2005   INCOMPLETE - to transverse colon  - diverticulosis, 475mpolyp was hyperplastic  . FOOT SURGERY     left - Dr. BeBeola Cord009  . LAPAROSCOPIC SALPINGOOPHERECTOMY  05/05/2006   LSO for complex left adnexal mass:  PATH--serous cystadenofibroma  . SHOULDER SURGERY     impingement syndrome  . TUBAL LIGATION    . UPPER GASTROINTESTINAL ENDOSCOPY  06/2002,    with dilation, esophageal stenosis  . VAGINAL HYSTERECTOMY N/A 12/25/2014   Procedure: HYSTERECTOMY VAGINAL;  Surgeon: ThNewton PiggMD;  [facility];  Service: Gynecology;  Laterality: N/A;     OB History   No  obstetric history on file.     Family History  Problem Relation Age of Onset  . Heart disease Mother 67       CHF with valvular dz  . Multiple myeloma Mother   . Colon polyps Mother   . Diabetes Mother   . Cancer Mother        multiple myel  . Breast cancer Other        cousin  . Lung cancer Other        uncle  . Heart disease Brother        one with aortic valve dz and one with CAD  . Colon cancer Neg Hx     Social History   Tobacco Use  . Smoking status: Never Smoker  .  Smokeless tobacco: Never Used  Substance Use Topics  . Alcohol use: No  . Drug use: No    Home Medications Prior to Admission medications   Medication Sig Start Date End Date Taking? Authorizing Provider  albuterol (PROAIR HFA) 108 (90 BASE) MCG/ACT inhaler Inhale 2 puffs into the lungs every 4 (four) hours as needed. 07/06/14  Yes McGowen, Adrian Blackwater, MD  albuterol (VENTOLIN HFA) 108 (90 Base) MCG/ACT inhaler Inhale into the lungs. 09/17/18  Yes [provider]  benzonatate (TESSALON) 200 MG capsule Take by mouth. 11/16/19 11/23/19 Yes [provider]  calcium carbonate (OS-CAL) 600 MG TABS Take 600 mg by mouth daily.   Yes [provider]  Cholecalciferol (VITAMIN D3) 1000 UNITS tablet Take 1,000 Units by mouth daily.     Yes [provider]  fish oil-omega-3 fatty acids 1000 MG capsule Take 1 g by mouth daily.    Yes [provider]  fluticasone (FLONASE) 50 MCG/ACT nasal spray fluticasone propionate 50 mcg/actuation nasal spray,suspension   Yes [provider]  fluticasone (FLOVENT DISKUS) 50 MCG/BLIST diskus inhaler Inhale 1 puff into the lungs 2 (two) times daily. 09/10/16  Yes McGowen, Adrian Blackwater, MD  metFORMIN (GLUCOPHAGE-XR) 500 MG 24 hr tablet Take by mouth. 07/07/19  Yes [provider]  Multiple Vitamin (MULTIVITAMIN) tablet Take 1 tablet by mouth daily.   Yes [provider]  naproxen (NAPROSYN) 500 MG tablet Take by mouth. 05/31/19  Yes [provider]  omeprazole (PRILOSEC OTC) 20 MG tablet Take 20 mg by mouth daily as needed (for heartburn).    Yes [provider]  clobetasol cream (TEMOVATE) 7.51 % Apply 1 application topically 2 (two) times daily.    [provider]  ibuprofen (ADVIL,MOTRIN) 600 MG tablet Take 1 tablet (600 mg total) by mouth every 6 (six) hours as needed. Patient not taking: Reported on 09/10/2016 12/26/14   Newton Pigg, MD  mupirocin nasal ointment (BACTROBAN) 2 % Place  1 application into the nose 2 (two) times a week.    [provider]  OVER THE COUNTER MEDICATION Take 1 capsule by mouth daily as needed (for bowel movements). Dr Carolanne Grumbling intestional cleanse    [provider]  predniSONE (DELTASONE) 20 MG tablet 2 tabs po qd x 5d, then 1 tab po qd x 5d 09/10/16   McGowen, Adrian Blackwater, MD    Allergies    Ciprofloxacin, Other, and Sulfonamide derivatives  Review of Systems   Review of Systems  Constitutional: Positive for fever.  HENT: Positive for congestion, postnasal drip and rhinorrhea.   Respiratory: Positive for cough, shortness of breath and wheezing.   Gastrointestinal: Negative for nausea and vomiting.  Musculoskeletal: Positive for arthralgias  and myalgias.  Skin: Negative for rash and wound.  Allergic/Immunologic: Negative for immunocompromised state.  Neurological: Positive for weakness.  All other systems reviewed and are negative.   Physical Exam Updated Vital Signs BP 103/69   Pulse (!) 102   Temp 98.8 F (37.1 C) (Oral)   Resp 20   Wt 74.8 kg   SpO2 92%   BMI 26.63 kg/m   Physical Exam Vitals and nursing note reviewed.  Constitutional:      General: She is not in acute distress.    Appearance: She is well-developed. She is not diaphoretic.  HENT:     Head: Normocephalic and atraumatic.  Cardiovascular:     Rate and Rhythm: Regular rhythm. Tachycardia present.  Pulmonary:     Effort: Pulmonary effort is normal.     Breath sounds: Examination of the right-lower field reveals rhonchi. Examination of the left-lower field reveals rhonchi. Rhonchi present. No wheezing.  Abdominal:     Palpations: Abdomen is soft.     Tenderness: There is no abdominal tenderness.  Musculoskeletal:     Right lower leg: No edema.     Left lower leg: No edema.  Skin:    General: Skin is warm and dry.     Findings: No erythema or rash.  Neurological:     Mental Status: She is alert and oriented to person, place, and time.    Psychiatric:        Behavior: Behavior normal.     ED Results / Procedures / Treatments   Labs (all labs ordered are listed, but only abnormal results are displayed) Labs Reviewed  LACTIC ACID, PLASMA - Abnormal; Notable for the following components:      Result Value   Lactic Acid, Venous 2.1 (*)    All other components within normal limits  CBC WITH DIFFERENTIAL/PLATELET - Abnormal; Notable for the following components:   WBC 3.0 (*)    Lymphs Abs 0.5 (*)    All other components within normal limits  COMPREHENSIVE METABOLIC PANEL - Abnormal; Notable for the following components:   Sodium 133 (*)    Glucose, Bld 210 (*)    Calcium 8.1 (*)    Total Protein 6.4 (*)    Albumin 3.1 (*)    AST 57 (*)    All other components within normal limits  CULTURE, BLOOD (ROUTINE X 2)  CULTURE, BLOOD (ROUTINE X 2)  D-DIMER, QUANTITATIVE (NOT AT [facility])  HIV ANTIBODY (ROUTINE TESTING W REFLEX)  LACTIC ACID, PLASMA  PROCALCITONIN  LACTATE DEHYDROGENASE  FERRITIN  TRIGLYCERIDES  FIBRINOGEN  C-REACTIVE PROTEIN    EKG EKG Interpretation  Date/Time:  Friday November 18 2019 12:08:36 EDT Ventricular Rate:  101 PR Interval:    QRS Duration: 74 QT Interval:  331 QTC Calculation: 429 R Axis:   24 Text Interpretation: Sinus tachycardia Abnormal R-wave progression, early transition Confirmed by Lennice Sites [phone]) on 11/18/2019 12:10:48 PM   Radiology DG Chest Port 1 View  Result Date: 11/18/2019 CLINICAL DATA:  COVID, dyspnea EXAM: PORTABLE CHEST 1 VIEW COMPARISON:  11/19/2010 chest radiograph. FINDINGS: Stable cardiomediastinal silhouette with normal heart size. No pneumothorax. No pleural effusion. Hazy patchy mid and lower right lung opacities, new. IMPRESSION: New hazy patchy mid and lower right lung opacities, suspicious for pneumonia. Electronically Signed   By: Ilona Sorrel M.D.   On: 11/18/2019 12:40    Procedures .Critical Care Performed by: Tacy Learn,  PA-C Authorized by: Tacy Learn, PA-C   Critical care  provider statement:    Critical care time (minutes):  45   Critical care was time spent personally by me on the following activities:  Discussions with consultants, evaluation of patient's response to treatment, examination of patient, ordering and performing treatments and interventions, ordering and review of laboratory studies, ordering and review of radiographic studies, pulse oximetry, re-evaluation of patient's condition, obtaining history from patient or surrogate and review of old charts   (including critical care time)  Medications Ordered in ED Medications  dexamethasone (DECADRON) injection 6 mg (6 mg Intravenous Given 11/18/19 1346)  remdesivir 100 mg in sodium chloride 0.9 % 100 mL IVPB (100 mg Intravenous New Bag/Given 11/18/19 1359)  remdesivir 100 mg in sodium chloride 0.9 % 100 mL IVPB (has no administration in time range)  0.9 %  sodium chloride infusion (has no administration in time range)  0.9 %  sodium chloride infusion (500 mLs Intravenous New Bag/Given 11/18/19 1357)  albuterol (VENTOLIN HFA) 108 (90 Base) MCG/ACT inhaler 8 puff (8 puffs Inhalation Given 11/18/19 1238)    ED Course  I have reviewed the triage vital signs and the nursing notes.  Pertinent labs & imaging results that were available during my care of the patient were reviewed by me and considered in my medical decision making (see chart for details).  Clinical Course as of Nov 17 1398  Fri Nov 17, 5536  7316 69 year old female with known COVID presents with worsening SHOB. Patient with O2 sat of 88% ambulatory on arrival and improved with 2LNC O2 to mid to low 90s. On exam, non toxic, not in distress, crackles in bases on lung exam. Labs with leukopenia with WBC 3.0, consistent with COVID. CMP with glucose of 210, otherwise no significant changes. Lactic acid 2.1. CXR with right mid and lower pneumonia. Discussed with Dr. Neysa Bonito with [facility], will order IV fluids, hold on antibiotics at this time, procalcitonin pending.    [LM]    Clinical Course User Index [LM] Roque Lias   MDM Rules/Calculators/A&P                          Final Clinical Impression(s) / ED Diagnoses Final diagnoses:  Pneumonia due to COVID-19 virus  Hypoxemia    Rx / DC Orders ED Discharge Orders    None       Tacy Learn, PA-C 11/18/19 1400    Lennice Sites, DO 11/18/19 1505

## 2019-11-18 NOTE — ED Notes (Signed)
[place] care power of attorney has questions about the safety of remdesivir,  Wants to be contacted before anymore is given  Phone # [phone]

## 2019-11-19 DIAGNOSIS — U071 COVID-19: Secondary | ICD-10-CM

## 2019-11-19 DIAGNOSIS — J1282 Pneumonia due to coronavirus disease 2019: Secondary | ICD-10-CM

## 2019-11-19 DIAGNOSIS — E119 Type 2 diabetes mellitus without complications: Secondary | ICD-10-CM

## 2019-11-19 DIAGNOSIS — J9601 Acute respiratory failure with hypoxia: Secondary | ICD-10-CM

## 2019-11-19 DIAGNOSIS — R0902 Hypoxemia: Secondary | ICD-10-CM

## 2019-11-19 LAB — PROCALCITONIN: Procalcitonin: <0.1 ng/mL

## 2019-11-19 LAB — COMPREHENSIVE METABOLIC PANEL
ALT: 39 U/L (ref 0–44)
AST: 49 U/L — ABNORMAL HIGH (ref 15–41)
Albumin: 2.9 g/dL — ABNORMAL LOW (ref 3.5–5.0)
Alkaline Phosphatase: 46 U/L (ref 38–126)
Anion gap: 10 (ref 5–15)
BUN: 15 mg/dL (ref 8–23)
CO2: 22 mmol/L (ref 22–32)
Calcium: 7.7 mg/dL — ABNORMAL LOW (ref 8.9–10.3)
Chloride: 106 mmol/L (ref 98–111)
Creatinine, Ser: 0.57 mg/dL (ref 0.44–1.00)
GFR calc Af Amer: >60 mL/min (ref >60)
GFR calc non Af Amer: >60 mL/min (ref >60)
Glucose, Bld: 207 mg/dL — ABNORMAL HIGH (ref 70–99)
Potassium: 4.5 mmol/L (ref 3.5–5.1)
Sodium: 138 mmol/L (ref 135–145)
Total Bilirubin: 0.2 mg/dL — ABNORMAL LOW (ref 0.3–1.2)
Total Protein: 5.9 g/dL — ABNORMAL LOW (ref 6.5–8.1)

## 2019-11-19 LAB — CBC WITH DIFFERENTIAL/PLATELET
Abs Immature Granulocytes: 0.01 10*3/uL (ref 0.00–0.07)
Basophils Absolute: 0 10*3/uL (ref 0.0–0.1)
Basophils Relative: 0 %
Eosinophils Absolute: 0 10*3/uL (ref 0.0–0.5)
Eosinophils Relative: 0 %
HCT: 36.6 % (ref 36.0–46.0)
Hemoglobin: 12.3 g/dL (ref 12.0–15.0)
Immature Granulocytes: 0 %
Lymphocytes Relative: 18 %
Lymphs Abs: 0.5 10*3/uL — ABNORMAL LOW (ref 0.7–4.0)
MCH: 31.2 pg (ref 26.0–34.0)
MCHC: 33.6 g/dL (ref 30.0–36.0)
MCV: 92.9 fL (ref 80.0–100.0)
Monocytes Absolute: 0.1 10*3/uL (ref 0.1–1.0)
Monocytes Relative: 5 %
Neutro Abs: 2 10*3/uL (ref 1.7–7.7)
Neutrophils Relative %: 77 %
Platelets: 184 10*3/uL (ref 150–400)
RBC: 3.94 MIL/uL (ref 3.87–5.11)
RDW: 13.2 % (ref 11.5–15.5)
WBC: 2.6 10*3/uL — ABNORMAL LOW (ref 4.0–10.5)
nRBC: 0 % (ref 0.0–0.2)

## 2019-11-19 LAB — LACTATE DEHYDROGENASE: LDH: 391 U/L — ABNORMAL HIGH (ref 98–192)

## 2019-11-19 LAB — HEPATITIS PANEL, ACUTE
HCV Ab: NONREACTIVE
Hep A IgM: NONREACTIVE
Hep B C IgM: NONREACTIVE
Hepatitis B Surface Ag: NONREACTIVE

## 2019-11-19 LAB — LIPID PANEL
Cholesterol: 122 mg/dL (ref 0–200)
HDL: 24 mg/dL — ABNORMAL LOW (ref >40)
LDL Cholesterol: 87 mg/dL (ref 0–99)
Total CHOL/HDL Ratio: 5.1 RATIO
Triglycerides: 57 mg/dL (ref <150)
VLDL: 11 mg/dL (ref 0–40)

## 2019-11-19 LAB — GLUCOSE, CAPILLARY
Glucose-Capillary: 139 mg/dL — ABNORMAL HIGH (ref 70–99)
Glucose-Capillary: 167 mg/dL — ABNORMAL HIGH (ref 70–99)
Glucose-Capillary: 178 mg/dL — ABNORMAL HIGH (ref 70–99)
Glucose-Capillary: 184 mg/dL — ABNORMAL HIGH (ref 70–99)
Glucose-Capillary: 282 mg/dL — ABNORMAL HIGH (ref 70–99)

## 2019-11-19 LAB — LACTIC ACID, PLASMA
Lactic Acid, Venous: 1.9 mmol/L (ref 0.5–1.9)
Lactic Acid, Venous: 1.2 mmol/L (ref 0.5–1.9)

## 2019-11-19 LAB — HEMOGLOBIN A1C
Hgb A1c MFr Bld: 6.4 % — ABNORMAL HIGH (ref 4.8–5.6)
Mean Plasma Glucose: 136.98 mg/dL

## 2019-11-19 LAB — FERRITIN: Ferritin: 263 ng/mL (ref 11–307)

## 2019-11-19 LAB — D-DIMER, QUANTITATIVE: D-Dimer, Quant: 0.28 ug/mL-FEU (ref 0.00–0.50)

## 2019-11-19 LAB — PHOSPHORUS: Phosphorus: 3.5 mg/dL (ref 2.5–4.6)

## 2019-11-19 LAB — C-REACTIVE PROTEIN: CRP: 6.2 mg/dL — ABNORMAL HIGH (ref <1.0)

## 2019-11-19 LAB — MAGNESIUM: Magnesium: 2.5 mg/dL — ABNORMAL HIGH (ref 1.7–2.4)

## 2019-11-19 MED ORDER — ZINC SULFATE 220 (50 ZN) MG PO CAPS
220.0000 mg | ORAL_CAPSULE | Freq: Every day | ORAL | Status: DC
  Administered 2019-11-19 – 2019-12-01 (×13): 220 mg via ORAL
  Filled 2019-11-19 (×13): qty 1

## 2019-11-19 MED ORDER — ALUM & MAG HYDROXIDE-SIMETH 200-200-20 MG/5ML PO SUSP
30.0000 mL | Freq: Four times a day (QID) | ORAL | Status: DC | PRN
  Administered 2019-11-19 – 2019-11-28 (×4): 30 mL via ORAL
  Filled 2019-11-19 (×4): qty 30

## 2019-11-19 MED ORDER — IPRATROPIUM-ALBUTEROL 20-100 MCG/ACT IN AERS
1.0000 | INHALATION_SPRAY | Freq: Four times a day (QID) | RESPIRATORY_TRACT | Status: DC
  Administered 2019-11-19 (×2): 1 via RESPIRATORY_TRACT
  Filled 2019-11-19: qty 4

## 2019-11-19 MED ORDER — IPRATROPIUM-ALBUTEROL 20-100 MCG/ACT IN AERS
1.0000 | INHALATION_SPRAY | Freq: Three times a day (TID) | RESPIRATORY_TRACT | Status: DC
  Administered 2019-11-20 – 2019-12-01 (×34): 1 via RESPIRATORY_TRACT

## 2019-11-19 MED ORDER — ASCORBIC ACID 500 MG PO TABS
500.0000 mg | ORAL_TABLET | Freq: Every day | ORAL | Status: DC
  Administered 2019-11-19 – 2019-12-01 (×13): 500 mg via ORAL
  Filled 2019-11-19 (×13): qty 1

## 2019-11-19 NOTE — Progress Notes (Signed)
PROGRESS NOTE    [name] [name]  NID:[id number] DOB: 25-Apr-1950 DOA: 11/18/2019 PCP: Ardith Dark, PA-C     Brief Narrative:  [name] [name] is a 69 y.o. WF PMHx Anxiety, Depression, type 2 diabetes, GERD, allergies with known Covid exposure and positive test on September 13.  She reports she has had symptoms for approximately 10 days.  She has history of asthma has an inhaler and has been using that without significant improvement.  In addition to her shortness of breath she reports body aches, cough, sore throat, congestion.  The patient has been self-medicating at home with vitamin C, vitamin D, zinc, and ivermectin that she bought at the store.  ED Course: Patient presented to the med Center in [facility] and was noted to have hypoxia, chest x-ray that revealed Covid pneumonia, and lactic acid of 2.0, C-reactive protein of 6.8, normal fibrinogen, normal triglycerides, normal ferritin, LDH of 382, procalcitonin of less than 0.1, D-dimer of 0.39.  She was noted to have a CBG of 210.  She also had a low white count of 3.0.   Subjective: A/O x4, positive S OB, negative CP, negative abdominal pain, negative nausea.  Does not believe in vaccinations, does not believe in current treatment.   Assessment & Plan: Covid vaccination; no vaccination   Principal Problem:   Acute hypoxemic respiratory failure due to COVID-19 [facility]) Active Problems:   Allergic rhinitis   GERD   Pneumonia due to COVID-19 virus   Type 2 diabetes mellitus ([place])   Acute respiratory failure with hypoxia ([place])   Diabetes mellitus type 2, controlled, without complications ([place])   Acute respiratory failure with hypoxia/Covid pneumonia COVID-19 Labs  Recent Labs    11/18/19 1225 11/19/19 0451 11/19/19 0452 11/19/19 0913  DDIMER 0.39 0.28  --   --   FERRITIN 231  --  263  --   LDH 382*  --   --  391*  CRP 6.8*  --  6.2*  --     9/13 SARS coronavirus positive (outside facility) -Solu-Medrol 75 mg  BID -Remdesivir x5 days per pharmacy protocol -Currently does not meet criteria for Baricitinib that this medication was experimental however this medication plus remdesivir head showed improved recovery time for patients on high flow O2.  Patient stated she had no history of hepatitis, TB, use of chemotherapy, or immunosuppressants for rheumatoid arthritis.  However declined to give permission to use if required.  Stated would like to have her husband briefed on medication and defer to him in regards to whether to use or not.   --Respimat QID --Flutter Valve --Incentive Spirometry -Vitamin C and zinc per Covid protocol  Asthma -See Covid  GERD -Prilosec 20 mg daily PRN  Allergic rhinitis  DM type II controlled without complication -1/44 hemoglobin A1c = 6.4  -Metformin XR 500 mg BID -Moderate SSI -Lipid panel pending   DVT prophylaxis: Lovenox Code Status: Full Family Communication:  Status is: Inpatient    Dispo: The patient is from: Home              Anticipated d/c is to: Home              Anticipated d/c date is: 9/23              Patient currently unstable      Consultants:    Procedures/Significant Events:    I have personally reviewed and interpreted all radiology studies and my findings are as above.  VENTILATOR SETTINGS:  Nasal cannula 9/18 Flow 2 L/min SPO2 92%   Cultures 9/13 SARS coronavirus positive (outside facility) 9/17 blood pending    Antimicrobials: Anti-infectives (From admission, onward)   Start     Dose/Rate Route Frequency Ordered Stop   11/19/19 1000  remdesivir 100 mg in sodium chloride 0.9 % 100 mL IVPB  Status:  Discontinued       "Followed by" Linked Group Details   100 mg 200 mL/hr over 30 Minutes Intravenous Daily 11/18/19 1219 11/18/19 1232   11/19/19 1000  remdesivir 100 mg in sodium chloride 0.9 % 100 mL IVPB        100 mg 200 mL/hr over 30 Minutes Intravenous Daily 11/18/19 1232 11/23/19 0959   11/18/19 1300   remdesivir 100 mg in sodium chloride 0.9 % 100 mL IVPB        100 mg 200 mL/hr over 30 Minutes Intravenous Every 30 min 11/18/19 1232 11/18/19 1552   11/18/19 1230  remdesivir 200 mg in sodium chloride 0.9% 250 mL IVPB  Status:  Discontinued       "Followed by" Linked Group Details   200 mg 580 mL/hr over 30 Minutes Intravenous Once 11/18/19 1219 11/18/19 1232       Devices    LINES / TUBES:      Continuous Infusions: . lactated ringers 100 mL/hr at 11/18/19 2231  . remdesivir 100 mg in NS 100 mL 100 mg (11/19/19 1311)     Objective: Vitals:   11/19/19 0022 11/19/19 0417 11/19/19 0813 11/19/19 1343  BP: 105/65 118/74 126/65 110/65  Pulse: 91 87 89 84  Resp: 20 (!) 24 17 17   Temp: 98.6 F (37 C) 98.7 F (37.1 C) 98.3 F (36.8 C) 98.5 F (36.9 C)  TempSrc:      SpO2: 92% 92%  (!) 88%  Weight:        Intake/Output Summary (Last 24 hours) at 11/19/2019 1722 Last data filed at 11/19/2019 1300 Gross per 24 hour  Intake 1914.96 ml  Output 500 ml  Net 1414.96 ml   Filed Weights   11/18/19 1156  Weight: 74.8 kg    Examination:  General: A/O x4, positive acute respiratory distress Eyes: negative scleral hemorrhage, negative anisocoria, negative icterus ENT: Negative Runny nose, negative gingival bleeding, Neck:  Negative scars, masses, torticollis, lymphadenopathy, JVD Lungs: poor air movement bilaterally without wheezes or crackles Cardiovascular: Regular rate and rhythm without murmur gallop or rub normal S1 and S2 Abdomen: negative abdominal pain, nondistended, positive soft, bowel sounds, no rebound, no ascites, no appreciable mass Extremities: No significant cyanosis, clubbing, or edema bilateral lower extremities Skin: Negative rashes, lesions, ulcers Psychiatric:  Negative depression, negative anxiety, negative fatigue, negative mania  Central nervous system:  Cranial nerves II through XII intact, tongue/uvula midline, all extremities muscle strength 5/5,  sensation intact throughout, negative dysarthria, negative expressive aphasia, negative receptive aphasia.  .     Data Reviewed: Care during the described time interval was provided by me .  I have reviewed this patient's available data, including medical history, events of note, physical examination, and all test results as part of my evaluation.  CBC: Recent Labs  Lab 11/18/19 1225 11/19/19 0451  WBC 3.0* 2.6*  NEUTROABS 2.3 2.0  HGB 13.0 12.3  HCT 38.3 36.6  MCV 91.0 92.9  PLT 190 712   Basic Metabolic Panel: Recent Labs  Lab 11/18/19 1225 11/19/19 0451  NA 133* 138  K 4.0 4.5  CL 100 106  CO2 23 22  GLUCOSE 210* 207*  BUN 12 15  CREATININE 0.66 0.57  CALCIUM 8.1* 7.7*  MG  --  2.5*  PHOS  --  3.5   GFR: CrCl cannot be calculated (Unknown ideal weight.). Liver Function Tests: Recent Labs  Lab 11/18/19 1225 11/19/19 0451  AST 57* 49*  ALT 41 39  ALKPHOS 42 46  BILITOT 0.4 0.2*  PROT 6.4* 5.9*  ALBUMIN 3.1* 2.9*   No results for input(s): LIPASE, AMYLASE in the last 168 hours. No results for input(s): AMMONIA in the last 168 hours. Coagulation Profile: No results for input(s): INR, PROTIME in the last 168 hours. Cardiac Enzymes: No results for input(s): CKTOTAL, CKMB, CKMBINDEX, TROPONINI in the last 168 hours. BNP (last 3 results) No results for input(s): PROBNP in the last 8760 hours. HbA1C: Recent Labs    11/19/19 0913  HGBA1C 6.4*   CBG: Recent Labs  Lab 11/19/19 0023 11/19/19 0736 11/19/19 1128 11/19/19 1614  GLUCAP 282* 167* 184* 178*   Lipid Profile: Recent Labs    11/18/19 1225 11/19/19 0913  CHOL  --  122  HDL  --  24*  LDLCALC  --  87  TRIG 63 57  CHOLHDL  --  5.1   Thyroid Function Tests: No results for input(s): TSH, T4TOTAL, FREET4, T3FREE, THYROIDAB in the last 72 hours. Anemia Panel: Recent Labs    11/18/19 1225 11/19/19 0452  FERRITIN 231 263   Sepsis Labs: Recent Labs  Lab 11/18/19 1225 11/18/19 1635  11/19/19 0913 11/19/19 1423 11/19/19 1629  PROCALCITON <0.10  --  <0.10  --   --   LATICACIDVEN 2.1* 2.0*  --  1.9 1.2    Recent Results (from the past 240 hour(s))  Blood Culture (routine x 2)     Status: None (Preliminary result)   Collection Time: 11/18/19 12:20 PM   Specimen: BLOOD  Result Value Ref Range Status   Specimen Description   Final    BLOOD LEFT ANTECUBITAL Performed at [facility], [place] [address], [place], [place] [address]    Special Requests   Final    BOTTLES DRAWN AEROBIC AND ANAEROBIC Blood Culture adequate volume Performed at [facility], [address] [place], [place] [address]    Culture   Final    NO GROWTH 1 DAY Performed at [facility], [place] [address] [place], [place] [address]    Report Status PENDING  Incomplete  Blood Culture (routine x 2)     Status: None (Preliminary result)   Collection Time: 11/18/19 12:50 PM   Specimen: BLOOD LEFT HAND  Result Value Ref Range Status   Specimen Description   Final    BLOOD LEFT HAND Performed at [facility], [address] [place], [place] [address]    Special Requests   Final    BOTTLES DRAWN AEROBIC AND ANAEROBIC Blood Culture results may not be optimal due to an inadequate volume of blood received in culture bottles Performed at [facility], [address] [place], [place] [address]    Culture   Final    NO GROWTH 1 DAY Performed at [facility], [place] [address], [place], [place] [address]    Report Status PENDING  Incomplete         Radiology Studies: DG Chest Port 1 View  Result Date: 11/18/2019 CLINICAL DATA:  COVID, dyspnea EXAM: PORTABLE CHEST 1 VIEW COMPARISON:  11/19/2010 chest radiograph. FINDINGS: Stable  cardiomediastinal silhouette with normal heart size. No pneumothorax. No pleural effusion. Hazy patchy mid and lower right lung opacities, new. IMPRESSION: New hazy patchy mid and lower right lung opacities, suspicious for  pneumonia. Electronically Signed   By: Ilona Sorrel M.D.   On: 11/18/2019 12:40        Scheduled Meds: . vitamin C  500 mg Oral Daily  . calcium carbonate  1 tablet Oral Q breakfast  . cholecalciferol  1,000 Units Oral Daily  . enoxaparin (LOVENOX) injection  40 mg Subcutaneous Q24H  . insulin aspart  0-15 Units Subcutaneous TID WC  . Ipratropium-Albuterol  1 puff Inhalation QID  . metFORMIN  500 mg Oral BID WC  . methylPREDNISolone (SOLU-MEDROL) injection  1 mg/kg Intravenous Q12H   Followed by  . [START ON 11/22/2019] predniSONE  50 mg Oral Daily  . zinc sulfate  220 mg Oral Daily   Continuous Infusions: . lactated ringers 100 mL/hr at 11/18/19 2231  . remdesivir 100 mg in NS 100 mL 100 mg (11/19/19 1311)     LOS: 1 day    Time spent:40 min    Shailen Thielen, Geraldo Docker, MD [facility] Pager [phone]  If 7PM-7AM, please contact night-coverage [email or web address] Password [facility] 11/19/2019, 5:22 PM

## 2019-11-20 LAB — CBC WITH DIFFERENTIAL/PLATELET
Abs Immature Granulocytes: 0.02 10*3/uL (ref 0.00–0.07)
Basophils Absolute: 0 10*3/uL (ref 0.0–0.1)
Basophils Relative: 0 %
Eosinophils Absolute: 0 10*3/uL (ref 0.0–0.5)
Eosinophils Relative: 0 %
HCT: 36.3 % (ref 36.0–46.0)
Hemoglobin: 12 g/dL (ref 12.0–15.0)
Immature Granulocytes: 0 %
Lymphocytes Relative: 21 %
Lymphs Abs: 1 10*3/uL (ref 0.7–4.0)
MCH: 31 pg (ref 26.0–34.0)
MCHC: 33.1 g/dL (ref 30.0–36.0)
MCV: 93.8 fL (ref 80.0–100.0)
Monocytes Absolute: 0.4 10*3/uL (ref 0.1–1.0)
Monocytes Relative: 8 %
Neutro Abs: 3.5 10*3/uL (ref 1.7–7.7)
Neutrophils Relative %: 71 %
Platelets: 274 10*3/uL (ref 150–400)
RBC: 3.87 MIL/uL (ref 3.87–5.11)
RDW: 13.2 % (ref 11.5–15.5)
WBC: 4.9 10*3/uL (ref 4.0–10.5)
nRBC: 0 % (ref 0.0–0.2)

## 2019-11-20 LAB — COMPREHENSIVE METABOLIC PANEL
ALT: 36 U/L (ref 0–44)
AST: 47 U/L — ABNORMAL HIGH (ref 15–41)
Albumin: 2.6 g/dL — ABNORMAL LOW (ref 3.5–5.0)
Alkaline Phosphatase: 43 U/L (ref 38–126)
Anion gap: 9 (ref 5–15)
BUN: 22 mg/dL (ref 8–23)
CO2: 23 mmol/L (ref 22–32)
Calcium: 7.7 mg/dL — ABNORMAL LOW (ref 8.9–10.3)
Chloride: 108 mmol/L (ref 98–111)
Creatinine, Ser: 0.57 mg/dL (ref 0.44–1.00)
GFR calc Af Amer: >60 mL/min (ref >60)
GFR calc non Af Amer: >60 mL/min (ref >60)
Glucose, Bld: 179 mg/dL — ABNORMAL HIGH (ref 70–99)
Potassium: 4 mmol/L (ref 3.5–5.1)
Sodium: 140 mmol/L (ref 135–145)
Total Bilirubin: 0.3 mg/dL (ref 0.3–1.2)
Total Protein: 5.4 g/dL — ABNORMAL LOW (ref 6.5–8.1)

## 2019-11-20 LAB — LIPID PANEL
Cholesterol: 123 mg/dL (ref 0–200)
HDL: 20 mg/dL — ABNORMAL LOW (ref >40)
LDL Cholesterol: 91 mg/dL (ref 0–99)
Total CHOL/HDL Ratio: 6.2 RATIO
Triglycerides: 62 mg/dL (ref <150)
VLDL: 12 mg/dL (ref 0–40)

## 2019-11-20 LAB — GLUCOSE, CAPILLARY
Glucose-Capillary: 167 mg/dL — ABNORMAL HIGH (ref 70–99)
Glucose-Capillary: 161 mg/dL — ABNORMAL HIGH (ref 70–99)
Glucose-Capillary: 166 mg/dL — ABNORMAL HIGH (ref 70–99)
Glucose-Capillary: 143 mg/dL — ABNORMAL HIGH (ref 70–99)

## 2019-11-20 LAB — PHOSPHORUS: Phosphorus: 4 mg/dL (ref 2.5–4.6)

## 2019-11-20 LAB — C-REACTIVE PROTEIN: CRP: 1.7 mg/dL — ABNORMAL HIGH (ref <1.0)

## 2019-11-20 LAB — PROCALCITONIN: Procalcitonin: <0.1 ng/mL

## 2019-11-20 LAB — MAGNESIUM: Magnesium: 2.5 mg/dL — ABNORMAL HIGH (ref 1.7–2.4)

## 2019-11-20 LAB — D-DIMER, QUANTITATIVE: D-Dimer, Quant: 0.41 ug/mL-FEU (ref 0.00–0.50)

## 2019-11-20 LAB — LACTATE DEHYDROGENASE: LDH: 422 U/L — ABNORMAL HIGH (ref 98–192)

## 2019-11-20 LAB — FERRITIN: Ferritin: 490 ng/mL — ABNORMAL HIGH (ref 11–307)

## 2019-11-20 NOTE — Progress Notes (Signed)
PROGRESS NOTE    [name] [name]  JHE:[id number] DOB: October 24, 1950 DOA: 11/18/2019 PCP: Ardith Dark, PA-C     Brief Narrative:  [name] [name] is a 69 y.o. WF PMHx Anxiety, Depression, type 2 diabetes, GERD, allergies with known Covid exposure and positive test on September 13.  She reports she has had symptoms for approximately 10 days.  She has history of asthma has an inhaler and has been using that without significant improvement.  In addition to her shortness of breath she reports body aches, cough, sore throat, congestion.  The patient has been self-medicating at home with vitamin C, vitamin D, zinc, and ivermectin that she bought at the store.  ED Course: Patient presented to the med Center in [facility] and was noted to have hypoxia, chest x-ray that revealed Covid pneumonia, and lactic acid of 2.0, C-reactive protein of 6.8, normal fibrinogen, normal triglycerides, normal ferritin, LDH of 382, procalcitonin of less than 0.1, D-dimer of 0.39.  She was noted to have a CBG of 210.  She also had a low white count of 3.0.   Subjective: 9/19 afebrile overnight A/O x4, positive S OB, negative CP, negative abdominal pain, negative nausea.   Assessment & Plan: Covid vaccination; no vaccination   Principal Problem:   Acute hypoxemic respiratory failure due to COVID-19 [facility]) Active Problems:   Allergic rhinitis   GERD   Pneumonia due to COVID-19 virus   Type 2 diabetes mellitus ([place])   Acute respiratory failure with hypoxia ([place])   Diabetes mellitus type 2, controlled, without complications ([place])   Acute respiratory failure with hypoxia/Covid pneumonia COVID-19 Labs  Recent Labs    11/18/19 1225 11/19/19 0451 11/19/19 0452 11/19/19 0913 11/20/19 0602  DDIMER 0.39 0.28  --   --  0.41  FERRITIN 231  --  263  --  490*  LDH 382*  --   --  391* 422*  CRP 6.8*  --  6.2*  --  1.7*    9/13 SARS coronavirus positive (outside facility)  -Solu-Medrol 75 mg BID -Remdesivir  x5 days per pharmacy protocol -Currently does not meet criteria for Baricitinib that this medication was experimental however this medication plus remdesivir head showed improved recovery time for patients on high flow O2.  Patient stated she had no history of hepatitis, TB, use of chemotherapy, or immunosuppressants for rheumatoid arthritis.  However declined to give permission to use if required.  Stated would like to have her husband briefed on medication and defer to him in regards to whether to use or not.   --Respimat QID --Flutter Valve --Incentive Spirometry -Vitamin C and zinc per Covid protocol  Asthma -See Covid  GERD -Prilosec 20 mg daily PRN  Allergic rhinitis  DM type II controlled without complication -1/85 hemoglobin A1c = 6.4  -Metformin XR 500 mg BID -Moderate SSI -Lipid panel pending   DVT prophylaxis: Lovenox Code Status: Full Family Communication: 9/19 spoke with [name] [name] (husband) explained plan of care answered all questions Status is: Inpatient    Dispo: The patient is from: Home              Anticipated d/c is to: Home              Anticipated d/c date is: 9/23              Patient currently unstable      Consultants:    Procedures/Significant Events:    I have personally reviewed and interpreted all radiology studies  and my findings are as above.  VENTILATOR SETTINGS: Nasal cannula 9/19 Flow 5 L/min SPO2 94%   Cultures 9/13 SARS coronavirus positive (outside facility) 9/17 blood pending    Antimicrobials: Anti-infectives (From admission, onward)   Start     Ordered Stop   11/19/19 1000  remdesivir 100 mg in sodium chloride 0.9 % 100 mL IVPB  Status:  Discontinued       "Followed by" Linked Group Details   11/18/19 1219 11/18/19 1232   11/19/19 1000  remdesivir 100 mg in sodium chloride 0.9 % 100 mL IVPB        11/18/19 1232 11/23/19 0959   11/18/19 1300  remdesivir 100 mg in sodium chloride 0.9 % 100 mL IVPB         11/18/19 1232 11/18/19 1552   11/18/19 1230  remdesivir 200 mg in sodium chloride 0.9% 250 mL IVPB  Status:  Discontinued       "Followed by" Linked Group Details   11/18/19 1219 11/18/19 1232       Devices    LINES / TUBES:      Continuous Infusions: . lactated ringers 100 mL/hr at 11/20/19 0300  . remdesivir 100 mg in NS 100 mL 100 mg (11/19/19 1311)     Objective: Vitals:   11/19/19 2000 11/19/19 2117 11/19/19 2234 11/20/19 0640  BP:  115/62  115/67  Pulse:  77  79  Resp:  18  18  Temp:  98.7 F (37.1 C)  98.5 F (36.9 C)  TempSrc:    Oral  SpO2: 90% 94% 97% 90%  Weight:        Intake/Output Summary (Last 24 hours) at 11/20/2019 9702 Last data filed at 11/20/2019 0300 Gross per 24 hour  Intake 3016.03 ml  Output --  Net 3016.03 ml   Filed Weights   11/18/19 1156  Weight: 74.8 kg   Physical Exam:  General: A/O x4, positive acute respiratory distress Eyes: negative scleral hemorrhage, negative anisocoria, negative icterus ENT: Negative Runny nose, negative gingival bleeding, Neck:  Negative scars, masses, torticollis, lymphadenopathy, JVD Lungs: Clear to auscultation bilaterally without wheezes or positive bibasilar  crackles Cardiovascular: Regular rate and rhythm without murmur gallop or rub normal S1 and S2 Abdomen: negative abdominal pain, nondistended, positive soft, bowel sounds, no rebound, no ascites, no appreciable mass Extremities: No significant cyanosis, clubbing, or edema bilateral lower extremities Skin: Negative rashes, lesions, ulcers Psychiatric:  Negative depression, negative anxiety, negative fatigue, negative mania  Central nervous system:  Cranial nerves II through XII intact, tongue/uvula midline, all extremities muscle strength 5/5, sensation intact throughout, negative dysarthria, negative expressive aphasia, negative receptive aphasia.  .     Data Reviewed: Care during the described time interval was provided by me .  I have  reviewed this patient's available data, including medical history, events of note, physical examination, and all test results as part of my evaluation.  CBC: Recent Labs  Lab 11/18/19 1225 11/19/19 0451 11/20/19 0602  WBC 3.0* 2.6* 4.9  NEUTROABS 2.3 2.0 3.5  HGB 13.0 12.3 12.0  HCT 38.3 36.6 36.3  MCV 91.0 92.9 93.8  PLT 190 184 637   Basic Metabolic Panel: Recent Labs  Lab 11/18/19 1225 11/19/19 0451 11/20/19 0602  NA 133* 138 140  K 4.0 4.5 4.0  CL 100 106 108  CO2 23 22 23   GLUCOSE 210* 207* 179*  BUN 12 15 22   CREATININE 0.66 0.57 0.57  CALCIUM 8.1* 7.7* 7.7*  MG  --  2.5* 2.5*  PHOS  --  3.5 4.0   GFR: CrCl cannot be calculated (Unknown ideal weight.). Liver Function Tests: Recent Labs  Lab 11/18/19 1225 11/19/19 0451 11/20/19 0602  AST 57* 49* 47*  ALT 41 39 36  ALKPHOS 42 46 43  BILITOT 0.4 0.2* 0.3  PROT 6.4* 5.9* 5.4*  ALBUMIN 3.1* 2.9* 2.6*   No results for input(s): LIPASE, AMYLASE in the last 168 hours. No results for input(s): AMMONIA in the last 168 hours. Coagulation Profile: No results for input(s): INR, PROTIME in the last 168 hours. Cardiac Enzymes: No results for input(s): CKTOTAL, CKMB, CKMBINDEX, TROPONINI in the last 168 hours. BNP (last 3 results) No results for input(s): PROBNP in the last 8760 hours. HbA1C: Recent Labs    11/19/19 0913  HGBA1C 6.4*   CBG: Recent Labs  Lab 11/19/19 0023 11/19/19 0736 11/19/19 1128 11/19/19 1614 11/19/19 2118  GLUCAP 282* 167* 184* 178* 139*   Lipid Profile: Recent Labs    11/19/19 0913 11/20/19 0602  CHOL 122 123  HDL 24* 20*  LDLCALC 87 91  TRIG 57 62  CHOLHDL 5.1 6.2   Thyroid Function Tests: No results for input(s): TSH, T4TOTAL, FREET4, T3FREE, THYROIDAB in the last 72 hours. Anemia Panel: Recent Labs    11/18/19 1225 11/19/19 0452  FERRITIN 231 263   Sepsis Labs: Recent Labs  Lab 11/18/19 1225 11/18/19 1635 11/19/19 0913 11/19/19 1423 11/19/19 1629    PROCALCITON <0.10  --  <0.10  --   --   LATICACIDVEN 2.1* 2.0*  --  1.9 1.2    Recent Results (from the past 240 hour(s))  Blood Culture (routine x 2)     Status: None (Preliminary result)   Collection Time: 11/18/19 12:20 PM   Specimen: BLOOD  Result Value Ref Range Status   Specimen Description   Final    BLOOD LEFT ANTECUBITAL Performed at [facility], [place] [address], [place], [place] [address]    Special Requests   Final    BOTTLES DRAWN AEROBIC AND ANAEROBIC Blood Culture adequate volume Performed at [facility], [address] [place], [place] [address]    Culture   Final    NO GROWTH 1 DAY Performed at [facility], [place] [address], [place], [place] [address]    Report Status PENDING  Incomplete  Blood Culture (routine x 2)     Status: None (Preliminary result)   Collection Time: 11/18/19 12:50 PM   Specimen: BLOOD LEFT HAND  Result Value Ref Range Status   Specimen Description   Final    BLOOD LEFT HAND Performed at [facility], [address] [place], [place] [address]    Special Requests   Final    BOTTLES DRAWN AEROBIC AND ANAEROBIC Blood Culture results may not be optimal due to an inadequate volume of blood received in culture bottles Performed at [facility], [address] [place], [place] [address]    Culture   Final    NO GROWTH 1 DAY Performed at [facility], [place] [address], [place], [place] [address]    Report Status PENDING  Incomplete         Radiology Studies: DG Chest Port 1 View  Result Date: 11/18/2019 CLINICAL DATA:  COVID, dyspnea EXAM: PORTABLE CHEST 1 VIEW COMPARISON:  11/19/2010 chest radiograph. FINDINGS: Stable cardiomediastinal silhouette with normal heart size. No pneumothorax. No pleural effusion. Hazy patchy mid  and lower right lung opacities, new. IMPRESSION: New hazy patchy mid and lower right lung opacities, suspicious for pneumonia. Electronically Signed   By: Ilona Sorrel M.D.   On: 11/18/2019 12:40        Scheduled Meds: . vitamin C  500 mg Oral Daily  . calcium carbonate  1 tablet Oral Q breakfast  . cholecalciferol  1,000 Units Oral Daily  . enoxaparin (LOVENOX) injection  40 mg Subcutaneous Q24H  . insulin aspart  0-15 Units Subcutaneous TID WC  . Ipratropium-Albuterol  1 puff Inhalation TID  . metFORMIN  500 mg Oral BID WC  . methylPREDNISolone (SOLU-MEDROL) injection  1 mg/kg Intravenous Q12H   Followed by  . [START ON 11/22/2019] predniSONE  50 mg Oral Daily  . zinc sulfate  220 mg Oral Daily   Continuous Infusions: . lactated ringers 100 mL/hr at 11/20/19 0300  . remdesivir 100 mg in NS 100 mL 100 mg (11/19/19 1311)     LOS: 2 days    Time spent:40 min    Cyanna Neace, Geraldo Docker, MD [facility] Pager [phone]  If 7PM-7AM, please contact night-coverage [email or web address] Password [facility] 11/20/2019, 7:21 AM

## 2019-11-20 NOTE — Progress Notes (Signed)
This shift RN educated pt about the importance of flutter valve  use and prone positioning. Pt agreed to try prone with my assistance and propping pillows. Pt laid prone for approx 3.5 hours tonight with  o2 sats > 96%. Afterwards positioned to R side, maintained sats 97%. Will continue to monitor

## 2019-11-20 NOTE — Progress Notes (Signed)
Received consult for PIV insertion. Attempted x2 while patient in the chair. Will need patient back in the bed to insert PIV with ultrasound. RN made aware and will place consult when patient is back in bed.

## 2019-11-21 LAB — COMPREHENSIVE METABOLIC PANEL
ALT: 34 U/L (ref 0–44)
AST: 43 U/L — ABNORMAL HIGH (ref 15–41)
Albumin: 2.5 g/dL — ABNORMAL LOW (ref 3.5–5.0)
Alkaline Phosphatase: 41 U/L (ref 38–126)
Anion gap: 9 (ref 5–15)
BUN: 24 mg/dL — ABNORMAL HIGH (ref 8–23)
CO2: 24 mmol/L (ref 22–32)
Calcium: 7.9 mg/dL — ABNORMAL LOW (ref 8.9–10.3)
Chloride: 105 mmol/L (ref 98–111)
Creatinine, Ser: 0.52 mg/dL (ref 0.44–1.00)
GFR calc Af Amer: >60 mL/min (ref >60)
GFR calc non Af Amer: >60 mL/min (ref >60)
Glucose, Bld: 189 mg/dL — ABNORMAL HIGH (ref 70–99)
Potassium: 4.1 mmol/L (ref 3.5–5.1)
Sodium: 138 mmol/L (ref 135–145)
Total Bilirubin: 0.5 mg/dL (ref 0.3–1.2)
Total Protein: 5.1 g/dL — ABNORMAL LOW (ref 6.5–8.1)

## 2019-11-21 LAB — CBC WITH DIFFERENTIAL/PLATELET
Abs Immature Granulocytes: 0.02 10*3/uL (ref 0.00–0.07)
Basophils Absolute: 0 10*3/uL (ref 0.0–0.1)
Basophils Relative: 0 %
Eosinophils Absolute: 0 10*3/uL (ref 0.0–0.5)
Eosinophils Relative: 0 %
HCT: 35.2 % — ABNORMAL LOW (ref 36.0–46.0)
Hemoglobin: 11.5 g/dL — ABNORMAL LOW (ref 12.0–15.0)
Immature Granulocytes: 0 %
Lymphocytes Relative: 17 %
Lymphs Abs: 0.8 10*3/uL (ref 0.7–4.0)
MCH: 30.8 pg (ref 26.0–34.0)
MCHC: 32.7 g/dL (ref 30.0–36.0)
MCV: 94.4 fL (ref 80.0–100.0)
Monocytes Absolute: 0.5 10*3/uL (ref 0.1–1.0)
Monocytes Relative: 10 %
Neutro Abs: 3.4 10*3/uL (ref 1.7–7.7)
Neutrophils Relative %: 73 %
Platelets: 287 10*3/uL (ref 150–400)
RBC: 3.73 MIL/uL — ABNORMAL LOW (ref 3.87–5.11)
RDW: 13.2 % (ref 11.5–15.5)
WBC: 4.7 10*3/uL (ref 4.0–10.5)
nRBC: 0 % (ref 0.0–0.2)

## 2019-11-21 LAB — PHOSPHORUS: Phosphorus: 4.3 mg/dL (ref 2.5–4.6)

## 2019-11-21 LAB — GLUCOSE, CAPILLARY
Glucose-Capillary: 182 mg/dL — ABNORMAL HIGH (ref 70–99)
Glucose-Capillary: 168 mg/dL — ABNORMAL HIGH (ref 70–99)
Glucose-Capillary: 155 mg/dL — ABNORMAL HIGH (ref 70–99)
Glucose-Capillary: 132 mg/dL — ABNORMAL HIGH (ref 70–99)

## 2019-11-21 LAB — PROCALCITONIN: Procalcitonin: <0.1 ng/mL

## 2019-11-21 LAB — D-DIMER, QUANTITATIVE: D-Dimer, Quant: 0.78 ug/mL-FEU — ABNORMAL HIGH (ref 0.00–0.50)

## 2019-11-21 LAB — LACTATE DEHYDROGENASE: LDH: 471 U/L — ABNORMAL HIGH (ref 98–192)

## 2019-11-21 LAB — MAGNESIUM: Magnesium: 2.4 mg/dL (ref 1.7–2.4)

## 2019-11-21 LAB — FERRITIN: Ferritin: 385 ng/mL — ABNORMAL HIGH (ref 11–307)

## 2019-11-21 LAB — C-REACTIVE PROTEIN: CRP: 0.7 mg/dL (ref <1.0)

## 2019-11-21 MED ORDER — LORATADINE 10 MG PO TABS
10.0000 mg | ORAL_TABLET | Freq: Every day | ORAL | Status: DC
  Administered 2019-11-21 – 2019-11-30 (×10): 10 mg via ORAL
  Filled 2019-11-21 (×10): qty 1

## 2019-11-21 NOTE — Care Management Important Message (Signed)
Important Message  Patient Details  Name: [name] [name] MRN: [id number] Date of Birth: [birth year]   Medicare Important Message Given:  Yes (Medicare IM printed for [facility], to give to the patient)     Crista Luria 11/21/2019, 11:10 AM

## 2019-11-21 NOTE — Progress Notes (Signed)
PROGRESS NOTE    [name] [name]  XNA:[id number] DOB: March 02, 1951 DOA: 11/18/2019 PCP: [name] Dark, PA-C     Brief Narrative:  [name] [name] is a 69 y.o. WF PMHx Anxiety, Depression, type 2 diabetes, GERD, allergies with known Covid exposure and positive test on September 13.  She reports she has had symptoms for approximately 10 days.  She has history of asthma has an inhaler and has been using that without significant improvement.  In addition to her shortness of breath she reports body aches, cough, sore throat, congestion.  The patient has been self-medicating at home with vitamin C, vitamin D, zinc, and ivermectin that she bought at the store.  ED Course: Patient presented to the med Center in [facility] and was noted to have hypoxia, chest x-ray that revealed Covid pneumonia, and lactic acid of 2.0, C-reactive protein of 6.8, normal fibrinogen, normal triglycerides, normal ferritin, LDH of 382, procalcitonin of less than 0.1, D-dimer of 0.39.  She was noted to have a CBG of 210.  She also had a low white count of 3.0.   Subjective: 9/20 A/O x4, positive S OB, negative CP.  Patient is proning.  Negative nausea, negative abdominal pain.   Assessment & Plan: Covid vaccination; no vaccination   Principal Problem:   Acute hypoxemic respiratory failure due to COVID-19 [facility]) Active Problems:   Allergic rhinitis   GERD   Pneumonia due to COVID-19 virus   Type 2 diabetes mellitus ([place])   Acute respiratory failure with hypoxia ([place])   Diabetes mellitus type 2, controlled, without complications ([place])   Acute respiratory failure with hypoxia/Covid pneumonia COVID-19 Labs  Recent Labs    11/18/19 1225 11/19/19 0451 11/19/19 0452 11/19/19 0913 11/20/19 0602 11/21/19 0547  DDIMER  --  0.28  --   --  0.41 0.78*  FERRITIN   < >  --  263  --  490* 385*  LDH   < >  --   --  391* 422* 471*  CRP   < >  --  6.2*  --  1.7* 0.7   < > = values in this interval not displayed.     9/13 SARS coronavirus positive (outside facility)  -Solu-Medrol 75 mg BID -Remdesivir x5 days per pharmacy protocol -Currently does not meet criteria for Baricitinib that this medication was experimental however this medication plus remdesivir head showed improved recovery time for patients on high flow O2.  Patient stated she had no history of hepatitis, TB, use of chemotherapy, or immunosuppressants for rheumatoid arthritis.  However declined to give permission to use if required.  Stated would like to have her husband briefed on medication and defer to him in regards to whether to use or not.   --Respimat QID --Flutter Valve --Incentive Spirometry -Vitamin C and zinc per Covid protocol  Asthma -See Covid  GERD -Prilosec 20 mg daily PRN  Allergic rhinitis -Claritin 10 mg QHS (Zyrtec not on formulary)   DM type II controlled without complication -5/42 hemoglobin A1c = 6.4  -Metformin XR 500 mg BID -Moderate SSI -Lipid panel pending   DVT prophylaxis: Lovenox Code Status: Full Family Communication: 9/19 spoke with [name] [name] (husband) explained plan of care answered all questions Status is: Inpatient    Dispo: The patient is from: Home              Anticipated d/c is to: Home              Anticipated d/c date is:  9/23              Patient currently unstable      Consultants:    Procedures/Significant Events:    I have personally reviewed and interpreted all radiology studies and my findings are as above.  VENTILATOR SETTINGS: HFNC 9/20 Flow 5 L/min SPO2 94%   Cultures 9/13 SARS coronavirus positive (outside facility) 9/17 blood pending    Antimicrobials: Anti-infectives (From admission, onward)   Start     Ordered Stop   11/19/19 1000  remdesivir 100 mg in sodium chloride 0.9 % 100 mL IVPB  Status:  Discontinued       "Followed by" Linked Group Details   11/18/19 1219 11/18/19 1232   11/19/19 1000  remdesivir 100 mg in sodium chloride 0.9 %  100 mL IVPB        11/18/19 1232 11/23/19 0959   11/18/19 1300  remdesivir 100 mg in sodium chloride 0.9 % 100 mL IVPB        11/18/19 1232 11/18/19 1552   11/18/19 1230  remdesivir 200 mg in sodium chloride 0.9% 250 mL IVPB  Status:  Discontinued       "Followed by" Linked Group Details   11/18/19 1219 11/18/19 1232       Devices    LINES / TUBES:      Continuous Infusions: . remdesivir 100 mg in NS 100 mL 100 mg (11/21/19 1040)     Objective: Vitals:   11/20/19 0640 11/20/19 1351 11/20/19 2000 11/21/19 0359  BP: 115/67 (!) 105/58  (!) 104/53  Pulse: 79 69  72  Resp: 18 19  20   Temp: 98.5 F (36.9 C) 97.6 F (36.4 C)  98.2 F (36.8 C)  TempSrc: Oral Oral    SpO2: 90% 92% 92% 93%  Weight:        Intake/Output Summary (Last 24 hours) at 11/21/2019 1136 Last data filed at 11/21/2019 9147 Gross per 24 hour  Intake 510 ml  Output --  Net 510 ml   Filed Weights   11/18/19 1156  Weight: 74.8 kg   Physical Exam:  General: A/O x4, positive acute respiratory distress Eyes: negative scleral hemorrhage, negative anisocoria, negative icterus ENT: Negative Runny nose, negative gingival bleeding, Neck:  Negative scars, masses, torticollis, lymphadenopathy, JVD Lungs: decreased breath sounds bilaterally without wheezes or crackles Cardiovascular: Regular rate and rhythm without murmur gallop or rub normal S1 and S2 Abdomen: negative abdominal pain, nondistended, positive soft, bowel sounds, no rebound, no ascites, no appreciable mass Extremities: No significant cyanosis, clubbing, or edema bilateral lower extremities Skin: Negative rashes, lesions, ulcers Psychiatric:  Negative depression, negative anxiety, negative fatigue, negative mania  Central nervous system:  Cranial nerves II through XII intact, tongue/uvula midline, all extremities muscle strength 5/5, sensation intact throughout, negative dysarthria, negative expressive aphasia, negative receptive aphasia.  .      Data Reviewed: Care during the described time interval was provided by me .  I have reviewed this patient's available data, including medical history, events of note, physical examination, and all test results as part of my evaluation.  CBC: Recent Labs  Lab 11/18/19 1225 11/19/19 0451 11/20/19 0602 11/21/19 0547  WBC 3.0* 2.6* 4.9 4.7  NEUTROABS 2.3 2.0 3.5 3.4  HGB 13.0 12.3 12.0 11.5*  HCT 38.3 36.6 36.3 35.2*  MCV 91.0 92.9 93.8 94.4  PLT 190 184 274 829   Basic Metabolic Panel: Recent Labs  Lab 11/18/19 1225 11/19/19 0451 11/20/19 0602 11/21/19 0547  NA  133* 138 140 138  K 4.0 4.5 4.0 4.1  CL 100 106 108 105  CO2 23 22 23 24   GLUCOSE 210* 207* 179* 189*  BUN 12 15 22  24*  CREATININE 0.66 0.57 0.57 0.52  CALCIUM 8.1* 7.7* 7.7* 7.9*  MG  --  2.5* 2.5* 2.4  PHOS  --  3.5 4.0 4.3   GFR: CrCl cannot be calculated (Unknown ideal weight.). Liver Function Tests: Recent Labs  Lab 11/18/19 1225 11/19/19 0451 11/20/19 0602 11/21/19 0547  AST 57* 49* 47* 43*  ALT 41 39 36 34  ALKPHOS 42 46 43 41  BILITOT 0.4 0.2* 0.3 0.5  PROT 6.4* 5.9* 5.4* 5.1*  ALBUMIN 3.1* 2.9* 2.6* 2.5*   No results for input(s): LIPASE, AMYLASE in the last 168 hours. No results for input(s): AMMONIA in the last 168 hours. Coagulation Profile: No results for input(s): INR, PROTIME in the last 168 hours. Cardiac Enzymes: No results for input(s): CKTOTAL, CKMB, CKMBINDEX, TROPONINI in the last 168 hours. BNP (last 3 results) No results for input(s): PROBNP in the last 8760 hours. HbA1C: Recent Labs    11/19/19 0913  HGBA1C 6.4*   CBG: Recent Labs  Lab 11/20/19 0732 11/20/19 1119 11/20/19 1632 11/20/19 2109 11/21/19 0801  GLUCAP 167* 161* 166* 143* 182*   Lipid Profile: Recent Labs    11/19/19 0913 11/20/19 0602  CHOL 122 123  HDL 24* 20*  LDLCALC 87 91  TRIG 57 62  CHOLHDL 5.1 6.2   Thyroid Function Tests: No results for input(s): TSH, T4TOTAL, FREET4, T3FREE,  THYROIDAB in the last 72 hours. Anemia Panel: Recent Labs    11/20/19 0602 11/21/19 0547  FERRITIN 490* 385*   Sepsis Labs: Recent Labs  Lab 11/18/19 1225 11/18/19 1635 11/19/19 0913 11/19/19 1423 11/19/19 1629 11/20/19 0602 11/21/19 0547  PROCALCITON <0.10  --  <0.10  --   --  <0.10 <0.10  LATICACIDVEN 2.1* 2.0*  --  1.9 1.2  --   --     Recent Results (from the past 240 hour(s))  Blood Culture (routine x 2)     Status: None (Preliminary result)   Collection Time: 11/18/19 12:20 PM   Specimen: BLOOD  Result Value Ref Range Status   Specimen Description   Final    BLOOD LEFT ANTECUBITAL Performed at [facility], [place] [address] [place], [place] [address]    Special Requests   Final    BOTTLES DRAWN AEROBIC AND ANAEROBIC Blood Culture adequate volume Performed at [facility], [place] [place], [place] [address]    Culture   Final    NO GROWTH 2 DAYS Performed at [facility], [place] [address], [place], [place] [address]    Report Status PENDING  Incomplete  Blood Culture (routine x 2)     Status: None (Preliminary result)   Collection Time: 11/18/19 12:50 PM   Specimen: BLOOD LEFT HAND  Result Value Ref Range Status   Specimen Description   Final    BLOOD LEFT HAND Performed at [facility] [place] [place], [place] [address]    Special Requests   Final    BOTTLES DRAWN AEROBIC AND ANAEROBIC Blood Culture results may not be optimal due to an inadequate volume of blood received in culture bottles Performed at [facility], [place] [place], [place] [address]    Culture   Final    NO GROWTH 2  DAYS Performed at [facility], [place] [address], [place], [place] [address]    Report Status PENDING  Incomplete         Radiology Studies: No results found.      Scheduled Meds: . vitamin C  500 mg Oral Daily  . calcium carbonate  1 tablet Oral Q breakfast  . cholecalciferol  1,000 Units  Oral Daily  . enoxaparin (LOVENOX) injection  40 mg Subcutaneous Q24H  . insulin aspart  0-15 Units Subcutaneous TID WC  . Ipratropium-Albuterol  1 puff Inhalation TID  . metFORMIN  500 mg Oral BID WC  . [START ON 11/22/2019] predniSONE  50 mg Oral Daily  . zinc sulfate  220 mg Oral Daily   Continuous Infusions: . remdesivir 100 mg in NS 100 mL 100 mg (11/21/19 1040)     LOS: 3 days    Time spent:40 min    Estell Dillinger, Geraldo Docker, MD [facility] Pager [phone]  If 7PM-7AM, please contact night-coverage [email or web address] Password [facility] 11/21/2019, 11:36 AM

## 2019-11-22 ENCOUNTER — Inpatient Hospital Stay (HOSPITAL_COMMUNITY): Payer: PPO

## 2019-11-22 ENCOUNTER — Encounter (HOSPITAL_COMMUNITY): Payer: Self-pay | Admitting: Family Medicine

## 2019-11-22 LAB — CBC WITH DIFFERENTIAL/PLATELET
Abs Immature Granulocytes: 0.06 10*3/uL (ref 0.00–0.07)
Basophils Absolute: 0 10*3/uL (ref 0.0–0.1)
Basophils Relative: 0 %
Eosinophils Absolute: 0 10*3/uL (ref 0.0–0.5)
Eosinophils Relative: 0 %
HCT: 36.6 % (ref 36.0–46.0)
Hemoglobin: 12.2 g/dL (ref 12.0–15.0)
Immature Granulocytes: 1 %
Lymphocytes Relative: 15 %
Lymphs Abs: 1.3 10*3/uL (ref 0.7–4.0)
MCH: 30.9 pg (ref 26.0–34.0)
MCHC: 33.3 g/dL (ref 30.0–36.0)
MCV: 92.7 fL (ref 80.0–100.0)
Monocytes Absolute: 0.7 10*3/uL (ref 0.1–1.0)
Monocytes Relative: 8 %
Neutro Abs: 6.7 10*3/uL (ref 1.7–7.7)
Neutrophils Relative %: 76 %
Platelets: 316 10*3/uL (ref 150–400)
RBC: 3.95 MIL/uL (ref 3.87–5.11)
RDW: 13.2 % (ref 11.5–15.5)
WBC: 8.7 10*3/uL (ref 4.0–10.5)
nRBC: 0 % (ref 0.0–0.2)

## 2019-11-22 LAB — COMPREHENSIVE METABOLIC PANEL
ALT: 32 U/L (ref 0–44)
AST: 42 U/L — ABNORMAL HIGH (ref 15–41)
Albumin: 2.6 g/dL — ABNORMAL LOW (ref 3.5–5.0)
Alkaline Phosphatase: 44 U/L (ref 38–126)
Anion gap: 8 (ref 5–15)
BUN: 23 mg/dL (ref 8–23)
CO2: 24 mmol/L (ref 22–32)
Calcium: 7.7 mg/dL — ABNORMAL LOW (ref 8.9–10.3)
Chloride: 107 mmol/L (ref 98–111)
Creatinine, Ser: 0.53 mg/dL (ref 0.44–1.00)
GFR calc Af Amer: >60 mL/min (ref >60)
GFR calc non Af Amer: >60 mL/min (ref >60)
Glucose, Bld: 102 mg/dL — ABNORMAL HIGH (ref 70–99)
Potassium: 3.8 mmol/L (ref 3.5–5.1)
Sodium: 139 mmol/L (ref 135–145)
Total Bilirubin: 0.6 mg/dL (ref 0.3–1.2)
Total Protein: 5.1 g/dL — ABNORMAL LOW (ref 6.5–8.1)

## 2019-11-22 LAB — GLUCOSE, CAPILLARY
Glucose-Capillary: 89 mg/dL (ref 70–99)
Glucose-Capillary: 123 mg/dL — ABNORMAL HIGH (ref 70–99)
Glucose-Capillary: 252 mg/dL — ABNORMAL HIGH (ref 70–99)
Glucose-Capillary: 90 mg/dL (ref 70–99)

## 2019-11-22 LAB — D-DIMER, QUANTITATIVE: D-Dimer, Quant: 3.09 ug/mL-FEU — ABNORMAL HIGH (ref 0.00–0.50)

## 2019-11-22 LAB — PHOSPHORUS: Phosphorus: 3.5 mg/dL (ref 2.5–4.6)

## 2019-11-22 LAB — LACTATE DEHYDROGENASE: LDH: 493 U/L — ABNORMAL HIGH (ref 98–192)

## 2019-11-22 LAB — MAGNESIUM: Magnesium: 2.5 mg/dL — ABNORMAL HIGH (ref 1.7–2.4)

## 2019-11-22 LAB — C-REACTIVE PROTEIN: CRP: 0.6 mg/dL (ref <1.0)

## 2019-11-22 LAB — FERRITIN: Ferritin: 326 ng/mL — ABNORMAL HIGH (ref 11–307)

## 2019-11-22 MED ORDER — BARICITINIB 2 MG PO TABS
4.0000 mg | ORAL_TABLET | Freq: Every day | ORAL | Status: DC
  Administered 2019-11-22 – 2019-12-01 (×10): 4 mg via ORAL
  Filled 2019-11-22 (×10): qty 2

## 2019-11-22 MED ORDER — IOHEXOL 350 MG/ML SOLN
100.0000 mL | Freq: Once | INTRAVENOUS | Status: AC | PRN
  Administered 2019-11-22: 100 mL via INTRAVENOUS

## 2019-11-22 NOTE — Progress Notes (Signed)
PROGRESS NOTE    [name] [name]  YWV:[id number] DOB: [birth year] DOA: 11/18/2019 PCP: Ardith Dark, PA-C     Brief Narrative:  [name] [name] is a 69 y.o. WF PMHx Anxiety, Depression, type 2 diabetes, GERD, allergies with known Covid exposure and positive test on September 13.  She reports she has had symptoms for approximately 10 days.  She has history of asthma has an inhaler and has been using that without significant improvement.  In addition to her shortness of breath she reports body aches, cough, sore throat, congestion.  The patient has been self-medicating at home with vitamin C, vitamin D, zinc, and ivermectin that she bought at the store.  ED Course: Patient presented to the med Center in [facility] and was noted to have hypoxia, chest x-ray that revealed Covid pneumonia, and lactic acid of 2.0, C-reactive protein of 6.8, normal fibrinogen, normal triglycerides, normal ferritin, LDH of 382, procalcitonin of less than 0.1, D-dimer of 0.39.  She was noted to have a CBG of 210.  She also had a low white count of 3.0.   Subjective: 9/21 afebrile overnight A/O x4, positive S OB, negative CP.  Negative nausea, negative abdominal pain.  Patient proning as requested.   Seen Assessment & Plan: Covid vaccination; no vaccination   Principal Problem:   Acute hypoxemic respiratory failure due to COVID-19 [facility]) Active Problems:   Allergic rhinitis   GERD   Pneumonia due to COVID-19 virus   Type 2 diabetes mellitus ([place])   Acute respiratory failure with hypoxia ([place])   Diabetes mellitus type 2, controlled, without complications ([place])   Acute respiratory failure with hypoxia/Covid pneumonia COVID-19 Labs  Recent Labs    11/20/19 0602 11/21/19 0547 11/22/19 0543  DDIMER 0.41 0.78* 3.09*  FERRITIN 490* 385* 326*  LDH 422* 471* 493*  CRP 1.7* 0.7 0.6    9/13 SARS coronavirus positive (outside facility)  -Solu-Medrol 75 mg BID -Remdesivir x5 days per pharmacy  protocol -Currently does not meet criteria for Baricitinib that this medication was experimental however this medication plus remdesivir head showed improved recovery time for patients on high flow O2.  Patient stated she had no history of hepatitis, TB, use of chemotherapy, or immunosuppressants for rheumatoid arthritis.  However declined to give permission to use if required.  Stated would like to have her husband briefed on medication and defer to him in regards to whether to use or not.   -9/21 husband gave approval for Baricitinib 4 mg x 14 days per pharmacy.  Although CRP not> 7 patient's respiratory status has deteriorated and now on 9 L/min O2  --Respimat QID --Flutter Valve --Incentive Spirometry -Vitamin C and zinc per Covid protocol -Out of bed to chair q shift  Elevated D-dimer -DVT/PE? -9/21 CTA PE protocol pending -9/21 bilateral lower extremity Doppler pending  Asthma -See Covid  GERD -Prilosec 20 mg daily PRN  Allergic rhinitis -Claritin 10 mg QHS (Zyrtec not on formulary)   DM type II controlled without complication -8/54 hemoglobin A1c = 6.4  -Metformin XR 500 mg BID -Moderate SSI -Lipid panel pending   DVT prophylaxis: Lovenox Code Status: Full Family Communication: 9/21 spoke with Delfino Lovett (husband) explained plan of care answered all questions x2  Status is: Inpatient    Dispo: The patient is from: Home              Anticipated d/c is to: Home              Anticipated d/c  date is: 9/23              Patient currently unstable      Consultants:    Procedures/Significant Events:    I have personally reviewed and interpreted all radiology studies and my findings are as above.  VENTILATOR SETTINGS: HFNC 9/21 Flow 9 L/min SPO2 95%    Cultures 9/13 SARS coronavirus positive (outside facility) 9/17 blood pending    Antimicrobials: Anti-infectives (From admission, onward)   Start     Ordered Stop   11/19/19 1000  remdesivir 100 mg  in sodium chloride 0.9 % 100 mL IVPB  Status:  Discontinued       "Followed by" Linked Group Details   11/18/19 1219 11/18/19 1232   11/19/19 1000  remdesivir 100 mg in sodium chloride 0.9 % 100 mL IVPB        11/18/19 1232 11/23/19 0959   11/18/19 1300  remdesivir 100 mg in sodium chloride 0.9 % 100 mL IVPB        11/18/19 1232 11/18/19 1552   11/18/19 1230  remdesivir 200 mg in sodium chloride 0.9% 250 mL IVPB  Status:  Discontinued       "Followed by" Linked Group Details   11/18/19 1219 11/18/19 1232       Devices    LINES / TUBES:      Continuous Infusions: . remdesivir 100 mg in NS 100 mL 100 mg (11/21/19 1040)     Objective: Vitals:   11/21/19 1325 11/21/19 1330 11/21/19 1946 11/22/19 0316  BP: (!) 100/56  104/62 129/67  Pulse: 72  77 80  Resp: 20  18 15   Temp: 98.1 F (36.7 C)  98.2 F (36.8 C) 98.7 F (37.1 C)  TempSrc: Oral  Oral Oral  SpO2: 90% 94% (!) 86% (!) 85%  Weight:        Intake/Output Summary (Last 24 hours) at 11/22/2019 6063 Last data filed at 11/22/2019 0319 Gross per 24 hour  Intake 472 ml  Output 675 ml  Net -203 ml   Filed Weights   11/18/19 1156  Weight: 74.8 kg   Physical Exam:  General: A/O x4, positive acute respiratory distress Eyes: negative scleral hemorrhage, negative anisocoria, negative icterus ENT: Negative Runny nose, negative gingival bleeding, Neck:  Negative scars, masses, torticollis, lymphadenopathy, JVD Lungs: decreased breath sounds bilaterally without wheezes or crackles Cardiovascular: Regular rate and rhythm without murmur gallop or rub normal S1 and S2 Abdomen: negative abdominal pain, nondistended, positive soft, bowel sounds, no rebound, no ascites, no appreciable mass Extremities: No significant cyanosis, clubbing, or edema bilateral lower extremities Skin: Negative rashes, lesions, ulcers Psychiatric:  Negative depression, negative anxiety, negative fatigue, negative mania  Central nervous system:   Cranial nerves II through XII intact, tongue/uvula midline, all extremities muscle strength 5/5, sensation intact throughout, negative dysarthria, negative expressive aphasia, negative receptive aphasia.  .     Data Reviewed: Care during the described time interval was provided by me .  I have reviewed this patient's available data, including medical history, events of note, physical examination, and all test results as part of my evaluation.  CBC: Recent Labs  Lab 11/18/19 1225 11/19/19 0451 11/20/19 0602 11/21/19 0547 11/22/19 0543  WBC 3.0* 2.6* 4.9 4.7 8.7  NEUTROABS 2.3 2.0 3.5 3.4 6.7  HGB 13.0 12.3 12.0 11.5* 12.2  HCT 38.3 36.6 36.3 35.2* 36.6  MCV 91.0 92.9 93.8 94.4 92.7  PLT 190 184 274 287 016   Basic Metabolic Panel: Recent  Labs  Lab 11/18/19 1225 11/19/19 0451 11/20/19 0602 11/21/19 0547 11/22/19 0543  NA 133* 138 140 138 139  K 4.0 4.5 4.0 4.1 3.8  CL 100 106 108 105 107  CO2 23 22 23 24 24   GLUCOSE 210* 207* 179* 189* 102*  BUN 12 15 22  24* 23  CREATININE 0.66 0.57 0.57 0.52 0.53  CALCIUM 8.1* 7.7* 7.7* 7.9* 7.7*  MG  --  2.5* 2.5* 2.4 2.5*  PHOS  --  3.5 4.0 4.3 3.5   GFR: CrCl cannot be calculated (Unknown ideal weight.). Liver Function Tests: Recent Labs  Lab 11/18/19 1225 11/19/19 0451 11/20/19 0602 11/21/19 0547 11/22/19 0543  AST 57* 49* 47* 43* 42*  ALT 41 39 36 34 32  ALKPHOS 42 46 43 41 44  BILITOT 0.4 0.2* 0.3 0.5 0.6  PROT 6.4* 5.9* 5.4* 5.1* 5.1*  ALBUMIN 3.1* 2.9* 2.6* 2.5* 2.6*   No results for input(s): LIPASE, AMYLASE in the last 168 hours. No results for input(s): AMMONIA in the last 168 hours. Coagulation Profile: No results for input(s): INR, PROTIME in the last 168 hours. Cardiac Enzymes: No results for input(s): CKTOTAL, CKMB, CKMBINDEX, TROPONINI in the last 168 hours. BNP (last 3 results) No results for input(s): PROBNP in the last 8760 hours. HbA1C: Recent Labs    11/19/19 0913  HGBA1C 6.4*   CBG: Recent  Labs  Lab 11/21/19 0801 11/21/19 1158 11/21/19 1608 11/21/19 2059 11/22/19 0734  GLUCAP 182* 168* 155* 132* 89   Lipid Profile: Recent Labs    11/19/19 0913 11/20/19 0602  CHOL 122 123  HDL 24* 20*  LDLCALC 87 91  TRIG 57 62  CHOLHDL 5.1 6.2   Thyroid Function Tests: No results for input(s): TSH, T4TOTAL, FREET4, T3FREE, THYROIDAB in the last 72 hours. Anemia Panel: Recent Labs    11/21/19 0547 11/22/19 0543  FERRITIN 385* 326*   Sepsis Labs: Recent Labs  Lab 11/18/19 1225 11/18/19 1635 11/19/19 0913 11/19/19 1423 11/19/19 1629 11/20/19 0602 11/21/19 0547  PROCALCITON <0.10  --  <0.10  --   --  <0.10 <0.10  LATICACIDVEN 2.1* 2.0*  --  1.9 1.2  --   --     Recent Results (from the past 240 hour(s))  Blood Culture (routine x 2)     Status: None (Preliminary result)   Collection Time: 11/18/19 12:20 PM   Specimen: BLOOD  Result Value Ref Range Status   Specimen Description   Final    BLOOD LEFT ANTECUBITAL Performed at [facility], [place] [address], [place], [place] [address]    Special Requests   Final    BOTTLES DRAWN AEROBIC AND ANAEROBIC Blood Culture adequate volume Performed at [facility], [address] [place], [place] [address]    Culture   Final    NO GROWTH 3 DAYS Performed at [facility], [place] [address], [place], [place] [address]    Report Status PENDING  Incomplete  Blood Culture (routine x 2)     Status: None (Preliminary result)   Collection Time: 11/18/19 12:50 PM   Specimen: BLOOD LEFT HAND  Result Value Ref Range Status   Specimen Description   Final    BLOOD LEFT HAND Performed at [facility], [address] [place], [place] [address]    Special Requests   Final    BOTTLES DRAWN AEROBIC AND ANAEROBIC Blood Culture results may not be optimal due to an inadequate volume  of blood received in culture bottles Performed at [facility], [address] [place], [place] [address]     Culture   Final    NO GROWTH 3 DAYS Performed at [facility], [place] [address] [place], [place] [address]    Report Status PENDING  Incomplete         Radiology Studies: No results found.      Scheduled Meds: . vitamin C  500 mg Oral Daily  . calcium carbonate  1 tablet Oral Q breakfast  . cholecalciferol  1,000 Units Oral Daily  . enoxaparin (LOVENOX) injection  40 mg Subcutaneous Q24H  . insulin aspart  0-15 Units Subcutaneous TID WC  . Ipratropium-Albuterol  1 puff Inhalation TID  . loratadine  10 mg Oral QHS  . metFORMIN  500 mg Oral BID WC  . predniSONE  50 mg Oral Daily  . zinc sulfate  220 mg Oral Daily   Continuous Infusions: . remdesivir 100 mg in NS 100 mL 100 mg (11/21/19 1040)     LOS: 4 days    Time spent:40 min    Aster Eckrich, Geraldo Docker, MD [facility] Pager [phone]  If 7PM-7AM, please contact night-coverage [email or web address] Password [facility] 11/22/2019, 8:03 AM

## 2019-11-22 NOTE — Progress Notes (Signed)
Patient now maintaining o2 sat of 92% on 9 LPM of high flow nasal cannula. No distress noted. Patient in prone position.

## 2019-11-22 NOTE — Progress Notes (Signed)
Patients o2 sats were 84-86 % on 6 LPM humidified oxygen via nasal cannula. Changed patient to high flow nasal cannula at 15 LPM of oxygen o2 sats now ranging 90%. Will notify MD. No distress noted. Instructed patient to use incentive spirometer and deep breathe.

## 2019-11-22 NOTE — Progress Notes (Signed)
Patients 02 sats are 84-86 % on 6 LPM humidified oxygen via nasal cannula, changed patient to a high flow nasal cannula on 15 LPM o2 sats now are at 90-92%. MD Dia Crawford aware, asks if we can prone patient, patient agrees to proning position after her breakfast.

## 2019-11-23 ENCOUNTER — Inpatient Hospital Stay (HOSPITAL_COMMUNITY): Payer: PPO

## 2019-11-23 DIAGNOSIS — R7989 Other specified abnormal findings of blood chemistry: Secondary | ICD-10-CM

## 2019-11-23 LAB — COMPREHENSIVE METABOLIC PANEL
ALT: 33 U/L (ref 0–44)
AST: 35 U/L (ref 15–41)
Albumin: 2.7 g/dL — ABNORMAL LOW (ref 3.5–5.0)
Alkaline Phosphatase: 48 U/L (ref 38–126)
Anion gap: 11 (ref 5–15)
BUN: 23 mg/dL (ref 8–23)
CO2: 23 mmol/L (ref 22–32)
Calcium: 7.8 mg/dL — ABNORMAL LOW (ref 8.9–10.3)
Chloride: 103 mmol/L (ref 98–111)
Creatinine, Ser: 0.49 mg/dL (ref 0.44–1.00)
GFR calc Af Amer: >60 mL/min (ref >60)
GFR calc non Af Amer: >60 mL/min (ref >60)
Glucose, Bld: 80 mg/dL (ref 70–99)
Potassium: 3.5 mmol/L (ref 3.5–5.1)
Sodium: 137 mmol/L (ref 135–145)
Total Bilirubin: 0.8 mg/dL (ref 0.3–1.2)
Total Protein: 5.5 g/dL — ABNORMAL LOW (ref 6.5–8.1)

## 2019-11-23 LAB — CBC WITH DIFFERENTIAL/PLATELET
Abs Immature Granulocytes: 0.11 10*3/uL — ABNORMAL HIGH (ref 0.00–0.07)
Basophils Absolute: 0 10*3/uL (ref 0.0–0.1)
Basophils Relative: 0 %
Eosinophils Absolute: 0 10*3/uL (ref 0.0–0.5)
Eosinophils Relative: 0 %
HCT: 37.6 % (ref 36.0–46.0)
Hemoglobin: 12.6 g/dL (ref 12.0–15.0)
Immature Granulocytes: 1 %
Lymphocytes Relative: 12 %
Lymphs Abs: 1.2 10*3/uL (ref 0.7–4.0)
MCH: 30.7 pg (ref 26.0–34.0)
MCHC: 33.5 g/dL (ref 30.0–36.0)
MCV: 91.5 fL (ref 80.0–100.0)
Monocytes Absolute: 0.4 10*3/uL (ref 0.1–1.0)
Monocytes Relative: 4 %
Neutro Abs: 8.7 10*3/uL — ABNORMAL HIGH (ref 1.7–7.7)
Neutrophils Relative %: 83 %
Platelets: 271 10*3/uL (ref 150–400)
RBC: 4.11 MIL/uL (ref 3.87–5.11)
RDW: 13.1 % (ref 11.5–15.5)
WBC: 10.4 10*3/uL (ref 4.0–10.5)
nRBC: 0 % (ref 0.0–0.2)

## 2019-11-23 LAB — FERRITIN: Ferritin: 293 ng/mL (ref 11–307)

## 2019-11-23 LAB — GLUCOSE, CAPILLARY
Glucose-Capillary: 85 mg/dL (ref 70–99)
Glucose-Capillary: 151 mg/dL — ABNORMAL HIGH (ref 70–99)
Glucose-Capillary: 231 mg/dL — ABNORMAL HIGH (ref 70–99)
Glucose-Capillary: 203 mg/dL — ABNORMAL HIGH (ref 70–99)

## 2019-11-23 LAB — CULTURE, BLOOD (ROUTINE X 2)
Culture: NO GROWTH
Culture: NO GROWTH
Special Requests: ADEQUATE

## 2019-11-23 LAB — D-DIMER, QUANTITATIVE: D-Dimer, Quant: 9.23 ug/mL-FEU — ABNORMAL HIGH (ref 0.00–0.50)

## 2019-11-23 LAB — LACTATE DEHYDROGENASE: LDH: 522 U/L — ABNORMAL HIGH (ref 98–192)

## 2019-11-23 LAB — PHOSPHORUS: Phosphorus: 3.8 mg/dL (ref 2.5–4.6)

## 2019-11-23 LAB — C-REACTIVE PROTEIN: CRP: 2.8 mg/dL — ABNORMAL HIGH (ref <1.0)

## 2019-11-23 LAB — MAGNESIUM: Magnesium: 2.2 mg/dL (ref 1.7–2.4)

## 2019-11-23 MED ORDER — METHYLPREDNISOLONE SODIUM SUCC 40 MG IJ SOLR
40.0000 mg | Freq: Two times a day (BID) | INTRAMUSCULAR | Status: DC
  Administered 2019-11-23 (×2): 40 mg via INTRAVENOUS
  Filled 2019-11-23 (×2): qty 1

## 2019-11-23 MED ORDER — FUROSEMIDE 10 MG/ML IJ SOLN
20.0000 mg | Freq: Once | INTRAMUSCULAR | Status: AC
  Administered 2019-11-23: 20 mg via INTRAVENOUS
  Filled 2019-11-23: qty 2

## 2019-11-23 NOTE — Progress Notes (Signed)
[facility] Progress Note  Patient: [name] [name]    GUR:[id number]  DOA: 11/18/2019     Date of Service: the patient was seen and examined on 11/23/2019  Brief hospital course: Past medical history of anxiety, depression, type II DM, GERD.  Presents with complaints of cough and shortness of breath found to have COVID-19 pneumonia. Currently plan is continue current care with remdesivir and steroids.  Assessment and Plan: 1.  COVID-19 pneumonia. Acute hypoxic respiratory failure POA. Sepsis secondary to pneumonia from COVID-19, POA With leukopenia, tachycardia and borderline hypotension meets SIRS criteria on admission. Secondary to COVID-19 pneumonia. Completed 5 days of remdesivir. Patient was on IV Solu-Medrol and then transition to prednisone. Now appears to be more short of breath as well as CRP trending up therefore we will reintroduce IV steroids. CT PE protocol on 11/22/2019 was negative for pulmonary embolism. Lower extremity Doppler negative for any DVT as well. D-dimer elevated but likely in response to inflammation. Baricitinib was also initiated.  2.  Type 2 diabetes mellitus, uncontrolled with hyperglycemia without complication Hemoglobin E8B 6.4.  Currently elevated is in the setting of steroids. Currently sliding scale.  3.  Fatigue and tiredness. In the setting of COVID-19. PT consultation.  4.  GERD On PPI as needed. Continue to monitor.  Diet: Cardiac diet DVT Prophylaxis:   enoxaparin (LOVENOX) injection 40 mg Start: 11/18/19 2200    Advance goals of care discussion: Full code  Family Communication: no family was present at bedside, at the time of interview.   Disposition:  Status is: Inpatient  Remains inpatient appropriate because:Hemodynamically unstable   Dispo: The patient is from: Home              Anticipated d/c is to: Home              Anticipated d/c date is: 3 days              Patient currently is not medically stable to  d/c.        Subjective: Continues to have shortness of breath and fatigue.  Continues to have cough.  Chest pain with cough.  No nausea no vomiting.  Minimal oral intake.  Physical Exam:  General: Appear in moderate distress, no Rash; Oral Mucosa Clear, moist. no Abnormal Neck Mass Or lumps, Conjunctiva normal  Cardiovascular: S1 and S2 Present, no Murmur, Respiratory: increased respiratory effort, Bilateral Air entry present and bilateral  Crackles, Occasional  wheezes Abdomen: Bowel Sound present, Soft and no tenderness Extremities: no Pedal edema Neurology: alert and oriented to time, place, and person affect anxious. no new focal deficit Gait not checked due to patient safety concerns  Vitals:   11/22/19 1950 11/22/19 1957 11/23/19 0344 11/23/19 1327  BP:  116/65 (!) 108/58 117/60  Pulse:  91 79 88  Resp:  18 15 18   Temp:  98.2 F (36.8 C) 98.4 F (36.9 C) 99.1 F (37.3 C)  TempSrc:  Oral Oral Oral  SpO2: 91% (!) 88% 95% 96%  Weight:       No intake or output data in the 24 hours ending 11/23/19 1856 Filed Weights   11/18/19 1156  Weight: 74.8 kg    Data Reviewed: I have personally reviewed and interpreted daily labs, tele strips, imagings as discussed above. I reviewed all nursing notes, pharmacy notes, vitals, pertinent old records I have discussed plan of care as described above with RN and patient/family.  CBC: Recent Labs  Lab 11/19/19 0451 11/20/19 0602  11/21/19 0547 11/22/19 0543 11/23/19 0857  WBC 2.6* 4.9 4.7 8.7 10.4  NEUTROABS 2.0 3.5 3.4 6.7 8.7*  HGB 12.3 12.0 11.5* 12.2 12.6  HCT 36.6 36.3 35.2* 36.6 37.6  MCV 92.9 93.8 94.4 92.7 91.5  PLT 184 274 287 316 009   Basic Metabolic Panel: Recent Labs  Lab 11/19/19 0451 11/20/19 0602 11/21/19 0547 11/22/19 0543 11/23/19 0435  NA 138 140 138 139 137  K 4.5 4.0 4.1 3.8 3.5  CL 106 108 105 107 103  CO2 22 23 24 24 23   GLUCOSE 207* 179* 189* 102* 80  BUN 15 22 24* 23 23  CREATININE 0.57  0.57 0.52 0.53 0.49  CALCIUM 7.7* 7.7* 7.9* 7.7* 7.8*  MG 2.5* 2.5* 2.4 2.5* 2.2  PHOS 3.5 4.0 4.3 3.5 3.8    Studies: VAS [place] LOWER EXTREMITY VENOUS (DVT)  Result Date: 11/23/2019  Lower Venous DVTStudy Indications: Elevated Ddimer.  Risk Factors: COVID 19 positive. Comparison Study: No prior studies. Performing Technologist: Oliver Hum RVT  Examination Guidelines: A complete evaluation includes B-mode imaging, spectral Doppler, color Doppler, and power Doppler as needed of all accessible portions of each vessel. Bilateral testing is considered an integral part of a complete examination. Limited examinations for reoccurring indications may be performed as noted. The reflux portion of the exam is performed with the patient in reverse Trendelenburg.  +---------+---------------+---------+-----------+----------+--------------+ RIGHT    CompressibilityPhasicitySpontaneityPropertiesThrombus Aging +---------+---------------+---------+-----------+----------+--------------+ CFV      Full           Yes      Yes                                 +---------+---------------+---------+-----------+----------+--------------+ SFJ      Full                                                        +---------+---------------+---------+-----------+----------+--------------+ FV Prox  Full                                                        +---------+---------------+---------+-----------+----------+--------------+ FV Mid   Full                                                        +---------+---------------+---------+-----------+----------+--------------+ FV DistalFull                                                        +---------+---------------+---------+-----------+----------+--------------+ PFV      Full                                                        +---------+---------------+---------+-----------+----------+--------------+  POP      Full           Yes       Yes                                 +---------+---------------+---------+-----------+----------+--------------+ PTV      Full                                                        +---------+---------------+---------+-----------+----------+--------------+ PERO     Full                                                        +---------+---------------+---------+-----------+----------+--------------+   +---------+---------------+---------+-----------+----------+--------------+ LEFT     CompressibilityPhasicitySpontaneityPropertiesThrombus Aging +---------+---------------+---------+-----------+----------+--------------+ CFV      Full           Yes      Yes                                 +---------+---------------+---------+-----------+----------+--------------+ SFJ      Full                                                        +---------+---------------+---------+-----------+----------+--------------+ FV Prox  Full                                                        +---------+---------------+---------+-----------+----------+--------------+ FV Mid   Full                                                        +---------+---------------+---------+-----------+----------+--------------+ FV DistalFull                                                        +---------+---------------+---------+-----------+----------+--------------+ PFV      Full                                                        +---------+---------------+---------+-----------+----------+--------------+ POP      Full           Yes      Yes                                 +---------+---------------+---------+-----------+----------+--------------+  PTV      Full                                                        +---------+---------------+---------+-----------+----------+--------------+ PERO     Full                                                         +---------+---------------+---------+-----------+----------+--------------+     Summary: RIGHT: - There is no evidence of deep vein thrombosis in the lower extremity.  - No cystic structure found in the popliteal fossa.  LEFT: - There is no evidence of deep vein thrombosis in the lower extremity.  - No cystic structure found in the popliteal fossa.  *See table(s) above for measurements and observations.    Preliminary     Scheduled Meds: . vitamin C  500 mg Oral Daily  . baricitinib  4 mg Oral Daily  . calcium carbonate  1 tablet Oral Q breakfast  . cholecalciferol  1,000 Units Oral Daily  . enoxaparin (LOVENOX) injection  40 mg Subcutaneous Q24H  . insulin aspart  0-15 Units Subcutaneous TID WC  . Ipratropium-Albuterol  1 puff Inhalation TID  . loratadine  10 mg Oral QHS  . methylPREDNISolone (SOLU-MEDROL) injection  40 mg Intravenous BID  . zinc sulfate  220 mg Oral Daily   Continuous Infusions: PRN Meds: acetaminophen, albuterol, alum & mag hydroxide-simeth, chlorpheniramine-HYDROcodone, fluticasone, guaiFENesin-dextromethorphan, omeprazole, ondansetron **OR** ondansetron (ZOFRAN) IV, polyethylene glycol, sodium chloride, traMADol, traZODone  Time spent: 35 minutes  Author: Berle Mull, MD [facility] 11/23/2019 6:56 PM  To reach On-call, see care teams to locate the attending and reach out via [email or web address]. Between 7PM-7AM, please contact night-coverage If you still have difficulty reaching the attending provider, please page the [facility] (Director on Call) for [facility] on amion for assistance.

## 2019-11-23 NOTE — Progress Notes (Signed)
Bilateral lower extremity venous duplex has been completed. Preliminary results can be found in CV Proc through chart review.   11/23/19 10:17 AM [name] [name] RVT

## 2019-11-24 ENCOUNTER — Inpatient Hospital Stay (HOSPITAL_COMMUNITY): Payer: PPO

## 2019-11-24 LAB — CBC WITH DIFFERENTIAL/PLATELET
Abs Immature Granulocytes: 0.07 10*3/uL (ref 0.00–0.07)
Basophils Absolute: 0 10*3/uL (ref 0.0–0.1)
Basophils Relative: 0 %
Eosinophils Absolute: 0 10*3/uL (ref 0.0–0.5)
Eosinophils Relative: 0 %
HCT: 36.8 % (ref 36.0–46.0)
Hemoglobin: 12.2 g/dL (ref 12.0–15.0)
Immature Granulocytes: 1 %
Lymphocytes Relative: 11 %
Lymphs Abs: 0.8 10*3/uL (ref 0.7–4.0)
MCH: 30.8 pg (ref 26.0–34.0)
MCHC: 33.2 g/dL (ref 30.0–36.0)
MCV: 92.9 fL (ref 80.0–100.0)
Monocytes Absolute: 0.3 10*3/uL (ref 0.1–1.0)
Monocytes Relative: 4 %
Neutro Abs: 6.3 10*3/uL (ref 1.7–7.7)
Neutrophils Relative %: 84 %
Platelets: 231 10*3/uL (ref 150–400)
RBC: 3.96 MIL/uL (ref 3.87–5.11)
RDW: 13 % (ref 11.5–15.5)
WBC: 7.5 10*3/uL (ref 4.0–10.5)
nRBC: 0 % (ref 0.0–0.2)

## 2019-11-24 LAB — COMPREHENSIVE METABOLIC PANEL
ALT: 28 U/L (ref 0–44)
AST: 26 U/L (ref 15–41)
Albumin: 2.8 g/dL — ABNORMAL LOW (ref 3.5–5.0)
Alkaline Phosphatase: 51 U/L (ref 38–126)
Anion gap: 12 (ref 5–15)
BUN: 25 mg/dL — ABNORMAL HIGH (ref 8–23)
CO2: 25 mmol/L (ref 22–32)
Calcium: 8.1 mg/dL — ABNORMAL LOW (ref 8.9–10.3)
Chloride: 103 mmol/L (ref 98–111)
Creatinine, Ser: 0.56 mg/dL (ref 0.44–1.00)
GFR calc Af Amer: >60 mL/min (ref >60)
GFR calc non Af Amer: >60 mL/min (ref >60)
Glucose, Bld: 200 mg/dL — ABNORMAL HIGH (ref 70–99)
Potassium: 3.8 mmol/L (ref 3.5–5.1)
Sodium: 140 mmol/L (ref 135–145)
Total Bilirubin: 0.7 mg/dL (ref 0.3–1.2)
Total Protein: 5.6 g/dL — ABNORMAL LOW (ref 6.5–8.1)

## 2019-11-24 LAB — GLUCOSE, CAPILLARY
Glucose-Capillary: 168 mg/dL — ABNORMAL HIGH (ref 70–99)
Glucose-Capillary: 272 mg/dL — ABNORMAL HIGH (ref 70–99)
Glucose-Capillary: 183 mg/dL — ABNORMAL HIGH (ref 70–99)
Glucose-Capillary: 225 mg/dL — ABNORMAL HIGH (ref 70–99)

## 2019-11-24 LAB — C-REACTIVE PROTEIN: CRP: 9.4 mg/dL — ABNORMAL HIGH (ref <1.0)

## 2019-11-24 LAB — D-DIMER, QUANTITATIVE: D-Dimer, Quant: 13.26 ug/mL-FEU — ABNORMAL HIGH (ref 0.00–0.50)

## 2019-11-24 LAB — MAGNESIUM: Magnesium: 2.6 mg/dL — ABNORMAL HIGH (ref 1.7–2.4)

## 2019-11-24 MED ORDER — METHYLPREDNISOLONE SODIUM SUCC 125 MG IJ SOLR
80.0000 mg | Freq: Two times a day (BID) | INTRAMUSCULAR | Status: DC
  Administered 2019-11-24 – 2019-11-27 (×7): 80 mg via INTRAVENOUS
  Filled 2019-11-24 (×7): qty 2

## 2019-11-24 MED ORDER — GLUCERNA SHAKE PO LIQD
237.0000 mL | Freq: Three times a day (TID) | ORAL | Status: DC
  Administered 2019-11-24 – 2019-11-25 (×3): 237 mL via ORAL
  Filled 2019-11-24 (×6): qty 237

## 2019-11-24 MED ORDER — SALINE SPRAY 0.65 % NA SOLN
2.0000 | NASAL | Status: DC | PRN
  Administered 2019-11-24 – 2019-11-28 (×3): 2 via NASAL
  Filled 2019-11-24: qty 44

## 2019-11-24 MED ORDER — MENTHOL 3 MG MT LOZG
1.0000 | LOZENGE | OROMUCOSAL | Status: DC | PRN
  Administered 2019-11-24 (×2): 3 mg via ORAL
  Filled 2019-11-24: qty 9

## 2019-11-24 NOTE — Care Management Important Message (Signed)
Important Message  Patient Details IM Letter given to the Patient Name: [name] [name] MRN: [id number] Date of Birth: 1950-10-16   Medicare Important Message Given:  Yes     Kerin Salen 11/24/2019, 9:55 AM

## 2019-11-24 NOTE — Evaluation (Signed)
Physical Therapy Evaluation Patient Details Name: [name] [name] MRN: [id number] DOB: 1950-08-02 Today's Date: 11/24/2019   History of Present Illness  Pt admitted with SOB 2* COVID PNA and with hx of DM and anxiety/depression  Clinical Impression  Pt admitted as above and presenting with functional mobility limitations 2* generalized weakness, mild ambulatory balance deficits and SOB/desat with limited exertion.  Pt should progress to dc home with family assist.    Follow Up Recommendations No PT follow up    Equipment Recommendations  None recommended by PT    Recommendations for Other Services       Precautions / Restrictions Precautions Precautions: Fall Precaution Comments: Monitor O2 Restrictions Weight Bearing Restrictions: No      Mobility  Bed Mobility Overal bed mobility: Modified Independent             General bed mobility comments: Increased time but no physical assist to move supine to EOB sitting  Transfers Overall transfer level: Needs assistance Equipment used: None Transfers: Sit to/from Stand Sit to Stand: Min assist         General transfer comment: Min assist to bring wt up and fwd and to balance in initial standing  Ambulation/Gait Ambulation/Gait assistance: Min assist Gait Distance (Feet): 5 Feet Assistive device: 1 person hand held assist Gait Pattern/deviations: Step-to pattern;Decreased step length - right;Decreased step length - left;Shuffle;Trunk flexed Gait velocity: decr   General Gait Details: min assist for support/balance; ltd by O2 desat to 81%  Stairs            Wheelchair Mobility    Modified Rankin (Stroke Patients Only)       Balance Overall balance assessment: Needs assistance Sitting-balance support: No upper extremity supported;Feet supported Sitting balance-Leahy Scale: Good     Standing balance support: Single extremity supported Standing balance-Leahy Scale: Fair                                Pertinent Vitals/Pain Pain Assessment: No/denies pain    Home Living Family/patient expects to be discharged to:: Private residence Living Arrangements: Spouse/significant other Available Help at Discharge: Family Type of Home: House Home Access: Level entry     Home Layout: One level Home Equipment: None Additional Comments: Spouse is currently in wc 2* ankle fx    Prior Function Level of Independence: Independent               Hand Dominance        Extremity/Trunk Assessment   Upper Extremity Assessment Upper Extremity Assessment: Generalized weakness    Lower Extremity Assessment Lower Extremity Assessment: Generalized weakness    Cervical / Trunk Assessment Cervical / Trunk Assessment: Normal  Communication   Communication: No difficulties  Cognition Arousal/Alertness: Awake/alert Behavior During Therapy: WFL for tasks assessed/performed;Flat affect Overall Cognitive Status: Within Functional Limits for tasks assessed                                        General Comments      Exercises     Assessment/Plan    PT Assessment Patient needs continued PT services  PT Problem List Decreased strength;Decreased activity tolerance;Decreased balance;Decreased mobility;Decreased knowledge of use of DME;Cardiopulmonary status limiting activity       PT Treatment Interventions DME instruction;Gait training;Functional mobility training;Therapeutic activities;Therapeutic exercise;Balance training;Patient/family education  PT Goals (Current goals can be found in the Care Plan section)  Acute Rehab PT Goals Patient Stated Goal: Breathe better and go home PT Goal Formulation: With patient Time For Goal Achievement: 12/08/19 Potential to Achieve Goals: Good    Frequency Min 3X/week   Barriers to discharge Decreased caregiver support Spouse currently in wc 2* ankle fx so unable to assist    Co-evaluation                AM-PAC PT "6 Clicks" Mobility  Outcome Measure Help needed turning from your back to your side while in a flat bed without using bedrails?: None Help needed moving from lying on your back to sitting on the side of a flat bed without using bedrails?: None Help needed moving to and from a bed to a chair (including a wheelchair)?: A Little Help needed standing up from a chair using your arms (e.g., wheelchair or bedside chair)?: A Little Help needed to walk in hospital room?: A Little Help needed climbing 3-5 steps with a railing? : A Lot 6 Click Score: 19    End of Session Equipment Utilized During Treatment: Gait belt;Oxygen Activity Tolerance: Patient limited by fatigue;Other (comment) (desat) Patient left: in chair;with call bell/phone within reach;with chair alarm set Nurse Communication: Mobility status PT Visit Diagnosis: Unsteadiness on feet (R26.81);Muscle weakness (generalized) (M62.81);Difficulty in walking, not elsewhere classified (R26.2)    Time: 7473-4037 PT Time Calculation (min) (ACUTE ONLY): 23 min   Charges:   PT Evaluation $PT Eval Low Complexity: 1 Low          [place] Pager [phone] Office [phone]   Emaly Boschert 11/24/2019, 4:26 PM

## 2019-11-24 NOTE — Progress Notes (Addendum)
Instructed Pt to lie prone prn as long as tolerated due to sats dropping below 85 percent. Pt instructed to leave nasal cannula in the nares at all times. Will continue to monitor.

## 2019-11-24 NOTE — Progress Notes (Addendum)
[facility] Progress Note  Patient: [name] [name]    JKK:[id number]  DOA: 11/18/2019     Date of Service: the patient was seen and examined on 11/24/2019  Brief hospital course: Past medical history of anxiety, depression, type II DM, GERD.  Presents with complaints of cough and shortness of breath found to have COVID-19 pneumonia. Currently plan is continue current care with remdesivir and steroids.  Assessment and Plan: 1. Acute COVID-19 Viral Pneumonia Acute hypoxic respiratory failure POA. Sepsis secondary to pneumonia from COVID-19, POA With leukopenia, tachycardia and borderline hypotension meets SIRS criteria on admission. CXR: hazy bilateral peripheral opacities CT chest: GGO, consolidation Oxygen requirement: On 8 LPM CRP: 6.2-1.7-0.7-0.6-2.8-9.4 D-dimer also trending up.  Remdesivir: Completed on 11/22/2019 Steroids: Initially on IV Solu-Medrol 75 mg twice daily transitioned to oral prednisone on 11/22/2019, back on IV Solu-Medrol due to worsening CRP. Baricitinib/Actemra(off-label use): Initiated on 11/22/2019. The investigational nature of this medication was discussed with the patient/HCPOA and they choose to proceed as the potential benefits are felt to outweigh risks at this time.  Antibiotics: Currently not indicated Vitamin C and Zinc: Continue DVT Prophylaxis: enoxaparin (LOVENOX) injection 40 mg Start: 11/18/19 2200 Prone positioning and incentive spirometer use recommended.  Overall plan: Continue with IV steroids in the setting of worsening inflammatory markers, PT OT consultation  The treatment plan and use of medications and known side effects were discussed with patient/family. It was clearly explained that Complete risks and long-term side effects are unknown, however in the best clinical judgment they seem to be of some clinical benefit rather than medical risks. Patient/family agree with the treatment plan and want to receive these treatments as  indicated.   2.  Type 2 diabetes mellitus, uncontrolled with hyperglycemia without complication Hemoglobin Z1I 6.4.  Currently elevated in the setting of steroids. Currently sliding scale.  3.  Fatigue and tiredness. In the setting of COVID-19. PT consultation.  4.  GERD On PPI as needed. Continue to monitor.  5.  Epistaxis Mild, patient reports some blood clots coming out of her nose every morning. H&H stable. Continue Ocean Spray.  Diet: Low-sodium diet DVT Prophylaxis:   enoxaparin (LOVENOX) injection 40 mg Start: 11/18/19 2200    Advance goals of care discussion: Full code  Family Communication: no family was present at bedside, at the time of interview.  The pt provided permission to discuss medical plan with the family.  Discussed with husband on the phone opportunity was given to ask question and all questions were answered satisfactorily.   Disposition:  Status is: Inpatient  Remains inpatient appropriate because:Hemodynamically unstable   Dispo: The patient is from: Home              Anticipated d/c is to: Home              Anticipated d/c date is: 3 days              Patient currently is not medically stable to d/c.  Subjective: Reports pleuritic chest pain when using incentive spirometry as well as hypoxia.  No nausea no vomiting but no fever no chills.  Oral intake improving.  Feeling better than yesterday.  Continues to have cough.  Physical Exam:  General: Appear in mild distress, no Rash; Oral Mucosa Clear, moist. no Abnormal Neck Mass Or lumps, Conjunctiva normal  Cardiovascular: S1 and S2 Present, no Murmur, Respiratory: increased respiratory effort, Bilateral Air entry present and bilateral  Crackles, no wheezes Abdomen: Bowel Sound present, Soft  and no tenderness Extremities: no Pedal edema Neurology: alert and oriented to time, place, and person affect appropriate. no new focal deficit Gait not checked due to patient safety concerns  Vitals:     11/23/19 1327 11/23/19 2107 11/24/19 0607 11/24/19 1347  BP: 117/60 101/74 (!) 97/51 (!) 93/56  Pulse: 88 81 63 87  Resp: 18  14 16   Temp: 99.1 F (37.3 C) 98.9 F (37.2 C) 98.5 F (36.9 C) 97.8 F (36.6 C)  TempSrc: Oral Oral Oral Oral  SpO2: 96% 91% 98% 90%  Weight:        Intake/Output Summary (Last 24 hours) at 11/24/2019 1640 Last data filed at 11/24/2019 1449 Gross per 24 hour  Intake 720 ml  Output 400 ml  Net 320 ml   Filed Weights   11/18/19 1156  Weight: 74.8 kg    Data Reviewed: I have personally reviewed and interpreted daily labs, tele strips, imagings as discussed above. I reviewed all nursing notes, pharmacy notes, vitals, pertinent old records I have discussed plan of care as described above with RN and patient/family.  CBC: Recent Labs  Lab 11/20/19 0602 11/21/19 0547 11/22/19 0543 11/23/19 0857 11/24/19 0443  WBC 4.9 4.7 8.7 10.4 7.5  NEUTROABS 3.5 3.4 6.7 8.7* 6.3  HGB 12.0 11.5* 12.2 12.6 12.2  HCT 36.3 35.2* 36.6 37.6 36.8  MCV 93.8 94.4 92.7 91.5 92.9  PLT 274 287 316 271 185   Basic Metabolic Panel: Recent Labs  Lab 11/19/19 0451 11/19/19 0451 11/20/19 0602 11/21/19 0547 11/22/19 0543 11/23/19 0435 11/24/19 0443  NA 138   < > 140 138 139 137 140  K 4.5   < > 4.0 4.1 3.8 3.5 3.8  CL 106   < > 108 105 107 103 103  CO2 22   < > 23 24 24 23 25   GLUCOSE 207*   < > 179* 189* 102* 80 200*  BUN 15   < > 22 24* 23 23 25*  CREATININE 0.57   < > 0.57 0.52 0.53 0.49 0.56  CALCIUM 7.7*   < > 7.7* 7.9* 7.7* 7.8* 8.1*  MG 2.5*   < > 2.5* 2.4 2.5* 2.2 2.6*  PHOS 3.5  --  4.0 4.3 3.5 3.8  --    < > = values in this interval not displayed.    Studies: DG CHEST PORT 1 VIEW  Result Date: 11/24/2019 CLINICAL DATA:  Shortness of breath.  COVID-19 positive EXAM: PORTABLE CHEST 1 VIEW COMPARISON:  November 18, 2019 and CT angiogram chest November 22, 2019 FINDINGS: Ill-defined areas of airspace opacity persist without progression compared to  most recent CT examination. Marginally less opacity noted compared to most recent chest radiograph. No consolidation evident. Heart size and pulmonary vascularity are normal. No adenopathy. No bone lesions. IMPRESSION: Ill-defined areas of airspace opacity likely representing multifocal atypical organism pneumonia. Slightly less opacity overall compared to most recent chest radiograph with essentially stable appearance compared to CT from 2 days prior. No new areas of airspace opacity. No consolidation. Cardiac silhouette normal.  No adenopathy Electronically Signed   By: Lowella Grip III M.D.   On: 11/24/2019 14:22    Scheduled Meds: . vitamin C  500 mg Oral Daily  . baricitinib  4 mg Oral Daily  . calcium carbonate  1 tablet Oral Q breakfast  . cholecalciferol  1,000 Units Oral Daily  . enoxaparin (LOVENOX) injection  40 mg Subcutaneous Q24H  . feeding supplement (GLUCERNA SHAKE)  237  mL Oral TID BM  . insulin aspart  0-15 Units Subcutaneous TID WC  . Ipratropium-Albuterol  1 puff Inhalation TID  . loratadine  10 mg Oral QHS  . methylPREDNISolone (SOLU-MEDROL) injection  80 mg Intravenous BID  . zinc sulfate  220 mg Oral Daily   Continuous Infusions: PRN Meds: acetaminophen, albuterol, alum & mag hydroxide-simeth, chlorpheniramine-HYDROcodone, fluticasone, guaiFENesin-dextromethorphan, menthol-cetylpyridinium, omeprazole, ondansetron **OR** ondansetron (ZOFRAN) IV, polyethylene glycol, sodium chloride, traMADol, traZODone  Time spent: 35 minutes  Author: Berle Mull, MD [facility] 11/24/2019 4:40 PM  To reach On-call, see care teams to locate the attending and reach out via [email or web address]. Between 7PM-7AM, please contact night-coverage If you still have difficulty reaching the attending provider, please page the [facility] (Director on Call) for [facility] on amion for assistance.

## 2019-11-25 LAB — D-DIMER, QUANTITATIVE: D-Dimer, Quant: 9.16 ug/mL-FEU — ABNORMAL HIGH (ref 0.00–0.50)

## 2019-11-25 LAB — COMPREHENSIVE METABOLIC PANEL
ALT: 24 U/L (ref 0–44)
AST: 23 U/L (ref 15–41)
Albumin: 2.4 g/dL — ABNORMAL LOW (ref 3.5–5.0)
Alkaline Phosphatase: 43 U/L (ref 38–126)
Anion gap: 9 (ref 5–15)
BUN: 27 mg/dL — ABNORMAL HIGH (ref 8–23)
CO2: 26 mmol/L (ref 22–32)
Calcium: 7.8 mg/dL — ABNORMAL LOW (ref 8.9–10.3)
Chloride: 102 mmol/L (ref 98–111)
Creatinine, Ser: 0.58 mg/dL (ref 0.44–1.00)
GFR calc Af Amer: >60 mL/min (ref >60)
GFR calc non Af Amer: >60 mL/min (ref >60)
Glucose, Bld: 208 mg/dL — ABNORMAL HIGH (ref 70–99)
Potassium: 4.2 mmol/L (ref 3.5–5.1)
Sodium: 137 mmol/L (ref 135–145)
Total Bilirubin: 0.6 mg/dL (ref 0.3–1.2)
Total Protein: 5.3 g/dL — ABNORMAL LOW (ref 6.5–8.1)

## 2019-11-25 LAB — CBC WITH DIFFERENTIAL/PLATELET
Abs Immature Granulocytes: 0.12 10*3/uL — ABNORMAL HIGH (ref 0.00–0.07)
Basophils Absolute: 0 10*3/uL (ref 0.0–0.1)
Basophils Relative: 0 %
Eosinophils Absolute: 0 10*3/uL (ref 0.0–0.5)
Eosinophils Relative: 0 %
HCT: 34.7 % — ABNORMAL LOW (ref 36.0–46.0)
Hemoglobin: 11.9 g/dL — ABNORMAL LOW (ref 12.0–15.0)
Immature Granulocytes: 1 %
Lymphocytes Relative: 8 %
Lymphs Abs: 0.9 10*3/uL (ref 0.7–4.0)
MCH: 30.9 pg (ref 26.0–34.0)
MCHC: 34.3 g/dL (ref 30.0–36.0)
MCV: 90.1 fL (ref 80.0–100.0)
Monocytes Absolute: 0.5 10*3/uL (ref 0.1–1.0)
Monocytes Relative: 4 %
Neutro Abs: 8.9 10*3/uL — ABNORMAL HIGH (ref 1.7–7.7)
Neutrophils Relative %: 87 %
Platelets: 248 10*3/uL (ref 150–400)
RBC: 3.85 MIL/uL — ABNORMAL LOW (ref 3.87–5.11)
RDW: 12.9 % (ref 11.5–15.5)
WBC: 10.4 10*3/uL (ref 4.0–10.5)
nRBC: 0 % (ref 0.0–0.2)

## 2019-11-25 LAB — GLUCOSE, CAPILLARY
Glucose-Capillary: 177 mg/dL — ABNORMAL HIGH (ref 70–99)
Glucose-Capillary: 241 mg/dL — ABNORMAL HIGH (ref 70–99)
Glucose-Capillary: 253 mg/dL — ABNORMAL HIGH (ref 70–99)
Glucose-Capillary: 223 mg/dL — ABNORMAL HIGH (ref 70–99)

## 2019-11-25 LAB — C-REACTIVE PROTEIN: CRP: 3.3 mg/dL — ABNORMAL HIGH (ref <1.0)

## 2019-11-25 LAB — MAGNESIUM: Magnesium: 2.6 mg/dL — ABNORMAL HIGH (ref 1.7–2.4)

## 2019-11-25 MED ORDER — PANTOPRAZOLE SODIUM 40 MG PO TBEC
40.0000 mg | DELAYED_RELEASE_TABLET | Freq: Every day | ORAL | Status: DC
  Administered 2019-11-25: 40 mg via ORAL
  Filled 2019-11-25: qty 1

## 2019-11-25 MED ORDER — GLUCERNA SHAKE PO LIQD
237.0000 mL | Freq: Three times a day (TID) | ORAL | Status: DC
  Administered 2019-11-26 – 2019-12-01 (×12): 237 mL via ORAL
  Filled 2019-11-25 (×20): qty 237

## 2019-11-25 MED ORDER — PANTOPRAZOLE SODIUM 40 MG PO TBEC
40.0000 mg | DELAYED_RELEASE_TABLET | Freq: Every day | ORAL | Status: DC
  Administered 2019-11-26 – 2019-11-28 (×3): 40 mg via ORAL
  Filled 2019-11-25 (×3): qty 1

## 2019-11-25 MED ORDER — OXYMETAZOLINE HCL 0.05 % NA SOLN
1.0000 | Freq: Two times a day (BID) | NASAL | Status: AC
  Administered 2019-11-25 – 2019-11-27 (×6): 1 via NASAL
  Filled 2019-11-25: qty 15

## 2019-11-25 MED ORDER — GLUCERNA SHAKE PO LIQD
237.0000 mL | Freq: Three times a day (TID) | ORAL | Status: DC
  Filled 2019-11-25 (×2): qty 237

## 2019-11-25 NOTE — Progress Notes (Signed)
[facility] Progress Note  Patient: [name] [name]    HER:[id number]  DOA: 11/18/2019     Date of Service: the patient was seen and examined on 11/25/2019  Brief hospital course: Past medical history of anxiety, depression, type II DM, GERD. Presents with complaints of cough and shortness of breath found to have COVID-19 pneumonia. Currently plan is continue supportive care for hypoxia as well as IV therapies.  Assessment and Plan: 1. Acute COVID-19 Viral Pneumonia Acute hypoxic respiratory failure POA. Sepsis secondary to pneumonia from COVID-19, POA With leukopenia, tachycardia and borderline hypotension meets SIRS criteria on admission. CXR: hazy bilateral peripheral opacities CT chest: GGO, consolidation Oxygen requirement: On 8 LPM CRP: 6.2-1.7-0.7-0.6-2.8-9.4-3.3 D-dimer also trending up.  Now from 13.26-9.16  Remdesivir: Completed on 11/22/2019 Steroids: Initially on IV Solu-Medrol 75 mg twice daily transitioned to oral prednisone on 11/22/2019, back on IV Solu-Medrol due to worsening CRP. Baricitinib/Actemra(off-label use): Initiated on 11/22/2019. The investigational nature of this medication was discussed with the patient/HCPOA and they choose to proceed as the potential benefits are felt to outweigh risks at this time.  Antibiotics: Currently not indicated Vitamin C and Zinc: Continue DVT Prophylaxis: enoxaparin (LOVENOX) injection 40 mg Start: 11/18/19 2200 Prone positioning and incentive spirometer use recommended.  Overall plan: Continue with IV steroids in the setting of worsening inflammatory markers, monitor for improvement in oxygenation  The treatment plan and use of medications and known side effects were discussed with patient/family. It was clearly explained that Complete risks and long-term side effects are unknown, however in the best clinical judgment they seem to be of some clinical benefit rather than medical risks. Patient/family agree with the  treatment plan and want to receive these treatments as indicated.   2.Type 2 diabetes mellitus, uncontrolled with hyperglycemia without complication Hemoglobin E5U 6.4. Currently elevated in the setting of steroids. Currently sliding scale.  3.Fatigue and tiredness. In the setting of COVID-19. PT consultation recommends no need for physical therapy outpatient.  4.GERD On PPI as needed. Continue to monitor.  5.  Epistaxis Mild, patient reports some blood clots coming out of her nose every morning. H&H stable. Continue Ocean Spray. Add Afrin spray  Diet: Low-sodium diet DVT Prophylaxis:   enoxaparin (LOVENOX) injection 40 mg Start: 11/18/19 2200    Advance goals of care discussion: Full code  Family Communication: no family was present at bedside, at the time of interview.  Discussed with husband on the phone. The pt provided permission to discuss medical plan with the family. Opportunity was given to ask question and all questions were answered satisfactorily.   Disposition:  Status is: Inpatient  Remains inpatient appropriate because:Inpatient level of care appropriate due to severity of illness   Dispo: The patient is from: Home              Anticipated d/c is to: Home              Anticipated d/c date is: > 3 days              Patient currently is not medically stable to d/c.  Subjective: Continues to have cough and shortness of breath.  Continues to have some small clots coming out of her nose.  No nausea no vomiting.  No fever no chills.  No bleeding.  Physical Exam:  General: Appear in mild distress, no Rash; Oral Mucosa Clear, moist. no Abnormal Neck Mass Or lumps, Conjunctiva normal  Cardiovascular: S1 and S2 Present, no Murmur, Respiratory: good respiratory  effort, Bilateral Air entry present and bilateral  Crackles, no wheezes Abdomen: Bowel Sound present, Soft and no tenderness Extremities: no Pedal edema Neurology: alert and oriented to time,  place, and person affect appropriate. no new focal deficit Gait not checked due to patient safety concerns  Vitals:   11/24/19 2240 11/24/19 2330 11/25/19 0647 11/25/19 1353  BP:   (!) 93/51 (!) 98/56  Pulse:  (!) 59 (!) 58 67  Resp:   15 17  Temp:   98.3 F (36.8 C) 98.1 F (36.7 C)  TempSrc:   Oral Oral  SpO2: 98% 97% 98% 94%  Weight:        Intake/Output Summary (Last 24 hours) at 11/25/2019 1919 Last data filed at 11/25/2019 1300 Gross per 24 hour  Intake 450 ml  Output 1200 ml  Net -750 ml   Filed Weights   11/18/19 1156  Weight: 74.8 kg    Data Reviewed: I have personally reviewed and interpreted daily labs, tele strips, imagings as discussed above. I reviewed all nursing notes, pharmacy notes, vitals, pertinent old records I have discussed plan of care as described above with RN and patient/family.  CBC: Recent Labs  Lab 11/21/19 0547 11/22/19 0543 11/23/19 0857 11/24/19 0443 11/25/19 0435  WBC 4.7 8.7 10.4 7.5 10.4  NEUTROABS 3.4 6.7 8.7* 6.3 8.9*  HGB 11.5* 12.2 12.6 12.2 11.9*  HCT 35.2* 36.6 37.6 36.8 34.7*  MCV 94.4 92.7 91.5 92.9 90.1  PLT 287 316 271 231 809   Basic Metabolic Panel: Recent Labs  Lab 11/19/19 0451 11/19/19 0451 11/20/19 0602 11/20/19 0602 11/21/19 0547 11/22/19 0543 11/23/19 0435 11/24/19 0443 11/25/19 0435  NA 138   < > 140   < > 138 139 137 140 137  K 4.5   < > 4.0   < > 4.1 3.8 3.5 3.8 4.2  CL 106   < > 108   < > 105 107 103 103 102  CO2 22   < > 23   < > 24 24 23 25 26   GLUCOSE 207*   < > 179*   < > 189* 102* 80 200* 208*  BUN 15   < > 22   < > 24* 23 23 25* 27*  CREATININE 0.57   < > 0.57   < > 0.52 0.53 0.49 0.56 0.58  CALCIUM 7.7*   < > 7.7*   < > 7.9* 7.7* 7.8* 8.1* 7.8*  MG 2.5*   < > 2.5*   < > 2.4 2.5* 2.2 2.6* 2.6*  PHOS 3.5  --  4.0  --  4.3 3.5 3.8  --   --    < > = values in this interval not displayed.    Studies: No results found.  Scheduled Meds: . vitamin C  500 mg Oral Daily  . baricitinib  4  mg Oral Daily  . calcium carbonate  1 tablet Oral Q breakfast  . cholecalciferol  1,000 Units Oral Daily  . enoxaparin (LOVENOX) injection  40 mg Subcutaneous Q24H  . feeding supplement (GLUCERNA SHAKE)  237 mL Oral TID BM  . insulin aspart  0-15 Units Subcutaneous TID WC  . Ipratropium-Albuterol  1 puff Inhalation TID  . loratadine  10 mg Oral QHS  . methylPREDNISolone (SOLU-MEDROL) injection  80 mg Intravenous BID  . oxymetazoline  1 spray Each Nare BID  . [START ON 11/26/2019] pantoprazole  40 mg Oral QAC breakfast  . zinc sulfate  220 mg Oral Daily  Continuous Infusions: PRN Meds: acetaminophen, albuterol, alum & mag hydroxide-simeth, chlorpheniramine-HYDROcodone, fluticasone, guaiFENesin-dextromethorphan, menthol-cetylpyridinium, ondansetron **OR** ondansetron (ZOFRAN) IV, polyethylene glycol, sodium chloride, traMADol, traZODone  Time spent: 35 minutes  Author: Berle Mull, MD [facility] 11/25/2019 7:19 PM  To reach On-call, see care teams to locate the attending and reach out via [email or web address]. Between 7PM-7AM, please contact night-coverage If you still have difficulty reaching the attending provider, please page the [facility] (Director on Call) for [facility] on amion for assistance.

## 2019-11-25 NOTE — Progress Notes (Signed)
Got call about food getting stuck in the pt's throat. Went to room and saw that the pt was not choking,but she said that she previously was. She stated that it is happening because she has not had her Prilosec which I told her the pharmacy has to send. She says she needs it daily before breakfast. MD paged about changing the order from PRN to scheduled.

## 2019-11-25 NOTE — Progress Notes (Signed)
Physical Therapy Treatment Patient Details Name: [name] [name] MRN: [id number] DOB: 23-Apr-1950 Today's Date: 11/25/2019    History of Present Illness Pt admitted with SOB 2* COVID PNA and with hx of DM and anxiety/depression    PT Comments    Pt in bed on 10 HFNC at 92%.  Assisted OOB to amb.  General bed mobility comments: Increased time but no physical assist to move supine to EOB sitting.  General transfer comment: sit to stand twice due to light dizziness.  No physical assist. General Gait Details: used a walker just for safety pt may not need once D/C'd.  Tolerated amb an increased distance in hallway.  Remained on 10 HFNC with sats lowest 86% and HR 92.  Dyspnea 2/4.  Coughing Mod.  Tolerated amb 25 feet then required an extended seated rest break (7 min) before amb back another 25 feet to her room.  MAX c/o nasal blockage/dryness and inability to "breath through my nose".  Follow Up Recommendations  No PT follow up     Equipment Recommendations  None recommended by PT    Recommendations for Other Services       Precautions / Restrictions Precautions Precautions: Fall Precaution Comments: Monitor O2    Mobility  Bed Mobility Overal bed mobility: Modified Independent             General bed mobility comments: Increased time but no physical assist to move supine to EOB sitting  Transfers Overall transfer level: Needs assistance Equipment used: None Transfers: Sit to/from Stand Sit to Stand: Supervision;Min guard         General transfer comment: sit to stand twice due to light dizziness.  No physical assist.  Ambulation/Gait Ambulation/Gait assistance: Supervision;Min guard Gait Distance (Feet): 50 Feet (25 feet x 2 one seated rest break) Assistive device: Rolling walker (2 wheeled) Gait Pattern/deviations: Step-to pattern;Decreased step length - right;Decreased step length - left;Shuffle;Trunk flexed Gait velocity: decreased   General Gait Details: used  a walker just for safety pt may not need once D/C'd.  Tolerated amb an increased distance in hallway.  Remained on 10 HFNC with sats lowest 86% and HR 92.  Dyspnea 2/4.  Coughing Mod.  Tolerated amb 25 feet then required an extended seated rest break (7 min) before amb back another 25 feet to her room.  MAX c/o nasal blockage/dryness and inability to "breath through my nose".   Stairs             Wheelchair Mobility    Modified Rankin (Stroke Patients Only)       Balance                                            Cognition Arousal/Alertness: Awake/alert Behavior During Therapy: WFL for tasks assessed/performed;Flat affect Overall Cognitive Status: Within Functional Limits for tasks assessed                                 General Comments: AxO x 3 slight fog "I don't know how many days I have been here"      Exercises      General Comments        Pertinent Vitals/Pain Pain Assessment: No/denies pain    Home Living  Prior Function            PT Goals (current goals can now be found in the care plan section) Progress towards PT goals: Progressing toward goals    Frequency    Min 3X/week      PT Plan Current plan remains appropriate    Co-evaluation              AM-PAC PT "6 Clicks" Mobility   Outcome Measure  Help needed turning from your back to your side while in a flat bed without using bedrails?: None Help needed moving from lying on your back to sitting on the side of a flat bed without using bedrails?: None Help needed moving to and from a bed to a chair (including a wheelchair)?: A Little Help needed standing up from a chair using your arms (e.g., wheelchair or bedside chair)?: A Little Help needed to walk in hospital room?: A Little Help needed climbing 3-5 steps with a railing? : A Lot 6 Click Score: 19    End of Session Equipment Utilized During Treatment: Gait  belt;Oxygen Activity Tolerance: Patient limited by fatigue Patient left: in chair;with call bell/phone within reach;with chair alarm set Nurse Communication: Mobility status PT Visit Diagnosis: Unsteadiness on feet (R26.81);Muscle weakness (generalized) (M62.81);Difficulty in walking, not elsewhere classified (R26.2)     Time: 4536-4680 PT Time Calculation (min) (ACUTE ONLY): 27 min  Charges:  $Gait Training: 8-22 mins $Therapeutic Activity: 8-22 mins                     Rica Koyanagi  PTA Acute  Rehabilitation Services Pager      [phone] Office      [phone]

## 2019-11-26 LAB — CBC WITH DIFFERENTIAL/PLATELET
Abs Immature Granulocytes: 0.14 10*3/uL — ABNORMAL HIGH (ref 0.00–0.07)
Basophils Absolute: 0 10*3/uL (ref 0.0–0.1)
Basophils Relative: 0 %
Eosinophils Absolute: 0 10*3/uL (ref 0.0–0.5)
Eosinophils Relative: 0 %
HCT: 35 % — ABNORMAL LOW (ref 36.0–46.0)
Hemoglobin: 11.7 g/dL — ABNORMAL LOW (ref 12.0–15.0)
Immature Granulocytes: 1 %
Lymphocytes Relative: 7 %
Lymphs Abs: 0.9 10*3/uL (ref 0.7–4.0)
MCH: 30.5 pg (ref 26.0–34.0)
MCHC: 33.4 g/dL (ref 30.0–36.0)
MCV: 91.4 fL (ref 80.0–100.0)
Monocytes Absolute: 0.7 10*3/uL (ref 0.1–1.0)
Monocytes Relative: 5 %
Neutro Abs: 11 10*3/uL — ABNORMAL HIGH (ref 1.7–7.7)
Neutrophils Relative %: 87 %
Platelets: 270 10*3/uL (ref 150–400)
RBC: 3.83 MIL/uL — ABNORMAL LOW (ref 3.87–5.11)
RDW: 13 % (ref 11.5–15.5)
WBC: 12.7 10*3/uL — ABNORMAL HIGH (ref 4.0–10.5)
nRBC: 0 % (ref 0.0–0.2)

## 2019-11-26 LAB — COMPREHENSIVE METABOLIC PANEL
ALT: 27 U/L (ref 0–44)
AST: 21 U/L (ref 15–41)
Albumin: 2.5 g/dL — ABNORMAL LOW (ref 3.5–5.0)
Alkaline Phosphatase: 41 U/L (ref 38–126)
Anion gap: 10 (ref 5–15)
BUN: 24 mg/dL — ABNORMAL HIGH (ref 8–23)
CO2: 27 mmol/L (ref 22–32)
Calcium: 8 mg/dL — ABNORMAL LOW (ref 8.9–10.3)
Chloride: 101 mmol/L (ref 98–111)
Creatinine, Ser: 0.49 mg/dL (ref 0.44–1.00)
GFR calc Af Amer: >60 mL/min (ref >60)
GFR calc non Af Amer: >60 mL/min (ref >60)
Glucose, Bld: 193 mg/dL — ABNORMAL HIGH (ref 70–99)
Potassium: 4.4 mmol/L (ref 3.5–5.1)
Sodium: 138 mmol/L (ref 135–145)
Total Bilirubin: 0.5 mg/dL (ref 0.3–1.2)
Total Protein: 5.4 g/dL — ABNORMAL LOW (ref 6.5–8.1)

## 2019-11-26 LAB — GLUCOSE, CAPILLARY
Glucose-Capillary: 184 mg/dL — ABNORMAL HIGH (ref 70–99)
Glucose-Capillary: 272 mg/dL — ABNORMAL HIGH (ref 70–99)
Glucose-Capillary: 211 mg/dL — ABNORMAL HIGH (ref 70–99)
Glucose-Capillary: 203 mg/dL — ABNORMAL HIGH (ref 70–99)

## 2019-11-26 LAB — C-REACTIVE PROTEIN: CRP: 1.1 mg/dL — ABNORMAL HIGH (ref <1.0)

## 2019-11-26 LAB — D-DIMER, QUANTITATIVE: D-Dimer, Quant: 7.7 ug/mL-FEU — ABNORMAL HIGH (ref 0.00–0.50)

## 2019-11-26 LAB — MAGNESIUM: Magnesium: 2.7 mg/dL — ABNORMAL HIGH (ref 1.7–2.4)

## 2019-11-26 NOTE — Progress Notes (Signed)
[facility] Progress Note  Patient: [name] [name]    VOJ:[id number]  DOA: 11/18/2019     Date of Service: the patient was seen and examined on 11/26/2019  Brief hospital course: Past medical history of anxiety, depression, type II DM, GERD. Presents with complaints of cough and shortness of breath found to have COVID-19 pneumonia. Currently plan is continue supportive care for hypoxia as well as IV therapies.  Assessment and Plan: 1. Acute COVID-19 Viral Pneumonia Acute hypoxic respiratory failure POA. Sepsis secondary to pneumonia from COVID-19, POA With leukopenia, tachycardia and borderline hypotension meets SIRS criteria on admission. CXR: hazy bilateral peripheral opacities CT chest: GGO, consolidation Oxygen requirement: On 8 LPM CRP: 6.2-1.7-0.7-0.6-2.8-9.4-3.3-1.1 D-dimer also trending up.  Now from 13.26-9.16-7.7  Remdesivir: Completed on 11/22/2019 Steroids: Initially on IV Solu-Medrol 75 mg twice daily transitioned to oral prednisone on 11/22/2019, back on IV Solu-Medrol due to worsening CRP. Baricitinib/Actemra(off-label use): Initiated on 11/22/2019. The investigational nature of this medication was discussed with the patient/HCPOA and they choose to proceed as the potential benefits are felt to outweigh risks at this time.  Antibiotics: Currently not indicated Vitamin C and Zinc: Continue DVT Prophylaxis: enoxaparin (LOVENOX) injection 40 mg Start: 11/18/19 2200 Prone positioning and incentive spirometer use recommended.  Overall plan: Continue with IV steroids in the persistent hypoxia, monitor for improvement in oxygenation  The treatment plan and use of medications and known side effects were discussed with patient/family. It was clearly explained that Complete risks and long-term side effects are unknown, however in the best clinical judgment they seem to be of some clinical benefit rather than medical risks. Patient/family agree with the treatment plan and  want to receive these treatments as indicated.   2.Type 2 diabetes mellitus, uncontrolled with hyperglycemia without complication Hemoglobin X9B 6.4. Currently elevated in the setting of steroids. Currently sliding scale.  3.Fatigue and tiredness. In the setting of COVID-19. PT consultation recommends no need for physical therapy outpatient.  4.GERD On PPI as needed.  Currently PPI schedule. Continue to monitor.  5.  Epistaxis Mild, patient reports some blood clots coming out of her nose every morning. H&H stable. Continue Ocean Spray. Add Afrin spray  Diet: Low-sodium diet DVT Prophylaxis:   enoxaparin (LOVENOX) injection 40 mg Start: 11/18/19 2200    Advance goals of care discussion: Full code  Family Communication: no family was present at bedside, at the time of interview.  Discussed with husband on the phone. The pt provided permission to discuss medical plan with the family. Opportunity was given to ask question and all questions were answered satisfactorily.   Disposition:  Status is: Inpatient  Remains inpatient appropriate because:Inpatient level of care appropriate due to severity of illness  Dispo: The patient is from: Home              Anticipated d/c is to: Home              Anticipated d/c date is: > 3 days              Patient currently is not medically stable to d/c.  Subjective: No nausea no vomiting.  No fever no chills.  Continues to have fatigue and tiredness.  Had more shortness of breath today compared to yesterday on exertion.  Physical Exam:  General: Appear in mild distress, no Rash; Oral Mucosa Clear, moist. no Abnormal Neck Mass Or lumps, Conjunctiva normal  Cardiovascular: S1 and S2 Present, no Murmur, Respiratory: good respiratory effort, Bilateral Air entry present  and bilateral  Crackles, no wheezes Abdomen: Bowel Sound present, Soft and no tenderness Extremities: no Pedal edema Neurology: alert and oriented to time, place,  and person affect appropriate. no new focal deficit Gait not checked due to patient safety concerns   Vitals:   11/25/19 2006 11/26/19 0502 11/26/19 0841 11/26/19 1430  BP: 106/62 (!) 103/55  102/61  Pulse: 67 (!) 55  72  Resp: 20 15  17   Temp: 97.8 F (36.6 C) 98.2 F (36.8 C)  97.9 F (36.6 C)  TempSrc: Oral   Oral  SpO2: 96% 98% 90% 95%  Weight:        Intake/Output Summary (Last 24 hours) at 11/26/2019 1833 Last data filed at 11/26/2019 1300 Gross per 24 hour  Intake 900 ml  Output 700 ml  Net 200 ml   Filed Weights   11/18/19 1156  Weight: 74.8 kg    Data Reviewed: I have personally reviewed and interpreted daily labs, tele strips, imagings as discussed above. I reviewed all nursing notes, pharmacy notes, vitals, pertinent old records I have discussed plan of care as described above with RN and patient/family.  CBC: Recent Labs  Lab 11/22/19 0543 11/23/19 0857 11/24/19 0443 11/25/19 0435 11/26/19 0543  WBC 8.7 10.4 7.5 10.4 12.7*  NEUTROABS 6.7 8.7* 6.3 8.9* 11.0*  HGB 12.2 12.6 12.2 11.9* 11.7*  HCT 36.6 37.6 36.8 34.7* 35.0*  MCV 92.7 91.5 92.9 90.1 91.4  PLT 316 271 231 248 267   Basic Metabolic Panel: Recent Labs  Lab 11/20/19 0602 11/20/19 0602 11/21/19 0547 11/21/19 0547 11/22/19 0543 11/23/19 0435 11/24/19 0443 11/25/19 0435 11/26/19 0543  NA 140   < > 138   < > 139 137 140 137 138  K 4.0   < > 4.1   < > 3.8 3.5 3.8 4.2 4.4  CL 108   < > 105   < > 107 103 103 102 101  CO2 23   < > 24   < > 24 23 25 26 27   GLUCOSE 179*   < > 189*   < > 102* 80 200* 208* 193*  BUN 22   < > 24*   < > 23 23 25* 27* 24*  CREATININE 0.57   < > 0.52   < > 0.53 0.49 0.56 0.58 0.49  CALCIUM 7.7*   < > 7.9*   < > 7.7* 7.8* 8.1* 7.8* 8.0*  MG 2.5*   < > 2.4   < > 2.5* 2.2 2.6* 2.6* 2.7*  PHOS 4.0  --  4.3  --  3.5 3.8  --   --   --    < > = values in this interval not displayed.    Studies: No results found.  Scheduled Meds: . vitamin C  500 mg Oral Daily    . baricitinib  4 mg Oral Daily  . calcium carbonate  1 tablet Oral Q breakfast  . cholecalciferol  1,000 Units Oral Daily  . enoxaparin (LOVENOX) injection  40 mg Subcutaneous Q24H  . feeding supplement (GLUCERNA SHAKE)  237 mL Oral TID BM  . insulin aspart  0-15 Units Subcutaneous TID WC  . Ipratropium-Albuterol  1 puff Inhalation TID  . loratadine  10 mg Oral QHS  . methylPREDNISolone (SOLU-MEDROL) injection  80 mg Intravenous BID  . oxymetazoline  1 spray Each Nare BID  . pantoprazole  40 mg Oral QAC breakfast  . zinc sulfate  220 mg Oral Daily   Continuous  Infusions: PRN Meds: acetaminophen, albuterol, alum & mag hydroxide-simeth, chlorpheniramine-HYDROcodone, fluticasone, guaiFENesin-dextromethorphan, menthol-cetylpyridinium, ondansetron **OR** ondansetron (ZOFRAN) IV, polyethylene glycol, sodium chloride, traMADol, traZODone  Time spent: 35 minutes  Author: Berle Mull, MD [facility] 11/26/2019 6:33 PM  To reach On-call, see care teams to locate the attending and reach out via [email or web address]. Between 7PM-7AM, please contact night-coverage If you still have difficulty reaching the attending provider, please page the [facility] (Director on Call) for [facility] on amion for assistance.

## 2019-11-26 NOTE — Progress Notes (Signed)
Physical Therapy Treatment Patient Details Name: [name] [name] MRN: [id number] DOB: 1950-07-06 Today's Date: 11/26/2019    History of Present Illness Pt admitted with SOB 2* COVID PNA and with hx of DM and anxiety/depression    PT Comments    Pt reports fatigue secondary to coughing today, but agreeable to mobility. Pt ambulated to and from bathroom with extended seated rest break to toilet, recover DOE 3/4, and recover SpO2 with minimum 75% on 8LO2. PT increased O2 to 10LO2 to aide in recovery as pt unable to raise SpO2 >80% initially. Pt with proximal LE weakness demonstrated in sit to stands today, PT had pt practice repeated sit to stands for strengthening and improved transfer form. PT encouraged proper breathing technique throughout mobility, which pt does well with when cued. PT to continue to follow acutely, may benefit from HHPT upon d/c.     Follow Up Recommendations  Home health PT     Equipment Recommendations  None recommended by PT    Recommendations for Other Services       Precautions / Restrictions Precautions Precautions: Fall Precaution Comments: Monitor O2 Restrictions Weight Bearing Restrictions: No    Mobility  Bed Mobility Overal bed mobility: Needs Assistance Bed Mobility: Supine to Sit     Supine to sit: Supervision     General bed mobility comments: for safety, HOB elevated  Transfers Overall transfer level: Needs assistance Equipment used: Rolling walker (2 wheeled) Transfers: Sit to/from Stand Sit to Stand: Min assist         General transfer comment: Min assist for initial power up, increased time to rise and steady self. STS x2 from bed and BSC, x3 from recliner as intervention for LE strengthening.  Ambulation/Gait Ambulation/Gait assistance: Min guard Gait Distance (Feet): 15 Feet (2x15 ft) Assistive device: Rolling walker (2 wheeled) Gait Pattern/deviations: Step-through pattern;Decreased stride length;Trunk flexed Gait  velocity: decreased   General Gait Details: min guard for safety, verbal cuing for proper breathing technique during mobility, room navigation. Seated rest break on BSC during toileting x5 minutes, SpO2 minimum 75% on 8LO2 requiring 10L to recover sats to mid-80s. Spo2 range 75-90% overall.   Stairs             Wheelchair Mobility    Modified Rankin (Stroke Patients Only)       Balance Overall balance assessment: Needs assistance Sitting-balance support: No upper extremity supported;Feet supported Sitting balance-Leahy Scale: Good     Standing balance support: Single extremity supported Standing balance-Leahy Scale: Fair                              Cognition Arousal/Alertness: Awake/alert Behavior During Therapy: WFL for tasks assessed/performed;Flat affect Overall Cognitive Status: Within Functional Limits for tasks assessed                                        Exercises General Exercises - Lower Extremity Long Arc Quad: AROM;Both;15 reps;Seated Mini-Sqauts: Both;AROM;Seated;Standing Mini Squats Limitations: x3 from recliner, with bilateral UE support on armrests of recliner    General Comments General comments (skin integrity, edema, etc.): 8LO2 via HFNC      Pertinent Vitals/Pain Pain Assessment: Faces Faces Pain Scale: Hurts a little bit Pain Location: chest, due to coughing Pain Descriptors / Indicators: Discomfort Pain Intervention(s): Limited activity within patient's tolerance;Monitored during session;Repositioned  Home Living                      Prior Function            PT Goals (current goals can now be found in the care plan section) Acute Rehab PT Goals Patient Stated Goal: Breathe better and go home PT Goal Formulation: With patient Time For Goal Achievement: 12/08/19 Potential to Achieve Goals: Good Progress towards PT goals: Progressing toward goals    Frequency    Min 3X/week       PT Plan Current plan remains appropriate    Co-evaluation              AM-PAC PT "6 Clicks" Mobility   Outcome Measure  Help needed turning from your back to your side while in a flat bed without using bedrails?: A Little Help needed moving from lying on your back to sitting on the side of a flat bed without using bedrails?: A Little Help needed moving to and from a bed to a chair (including a wheelchair)?: A Little Help needed standing up from a chair using your arms (e.g., wheelchair or bedside chair)?: A Little Help needed to walk in hospital room?: A Little Help needed climbing 3-5 steps with a railing? : A Lot 6 Click Score: 17    End of Session Equipment Utilized During Treatment: Gait belt;Oxygen Activity Tolerance: Patient limited by fatigue Patient left: in chair;with call bell/phone within reach;with chair alarm set Nurse Communication: Mobility status PT Visit Diagnosis: Unsteadiness on feet (R26.81);Muscle weakness (generalized) (M62.81);Difficulty in walking, not elsewhere classified (R26.2)     Time: 1301-1330 PT Time Calculation (min) (ACUTE ONLY): 29 min  Charges:  $Gait Training: 8-22 mins $Therapeutic Activity: 8-22 mins                     Lucelia Lacey E, PT Acute Rehabilitation Services Pager [phone]  Office [phone]    Roxine Caddy D Elonda Husky 11/26/2019, 3:43 PM

## 2019-11-26 NOTE — Plan of Care (Signed)
  Problem: Education: Goal: Knowledge of General Education information will improve Description Including pain rating scale, medication(s)/side effects and non-pharmacologic comfort measures Outcome: Progressing   

## 2019-11-27 LAB — CBC WITH DIFFERENTIAL/PLATELET
Abs Immature Granulocytes: 0.25 10*3/uL — ABNORMAL HIGH (ref 0.00–0.07)
Basophils Absolute: 0 10*3/uL (ref 0.0–0.1)
Basophils Relative: 0 %
Eosinophils Absolute: 0 10*3/uL (ref 0.0–0.5)
Eosinophils Relative: 0 %
HCT: 35.8 % — ABNORMAL LOW (ref 36.0–46.0)
Hemoglobin: 12 g/dL (ref 12.0–15.0)
Immature Granulocytes: 2 %
Lymphocytes Relative: 6 %
Lymphs Abs: 0.8 10*3/uL (ref 0.7–4.0)
MCH: 30.9 pg (ref 26.0–34.0)
MCHC: 33.5 g/dL (ref 30.0–36.0)
MCV: 92.3 fL (ref 80.0–100.0)
Monocytes Absolute: 0.7 10*3/uL (ref 0.1–1.0)
Monocytes Relative: 5 %
Neutro Abs: 13.2 10*3/uL — ABNORMAL HIGH (ref 1.7–7.7)
Neutrophils Relative %: 87 %
Platelets: 257 10*3/uL (ref 150–400)
RBC: 3.88 MIL/uL (ref 3.87–5.11)
RDW: 12.9 % (ref 11.5–15.5)
WBC: 15 10*3/uL — ABNORMAL HIGH (ref 4.0–10.5)
nRBC: 0 % (ref 0.0–0.2)

## 2019-11-27 LAB — COMPREHENSIVE METABOLIC PANEL
ALT: 28 U/L (ref 0–44)
AST: 22 U/L (ref 15–41)
Albumin: 2.5 g/dL — ABNORMAL LOW (ref 3.5–5.0)
Alkaline Phosphatase: 40 U/L (ref 38–126)
Anion gap: 8 (ref 5–15)
BUN: 21 mg/dL (ref 8–23)
CO2: 27 mmol/L (ref 22–32)
Calcium: 8.3 mg/dL — ABNORMAL LOW (ref 8.9–10.3)
Chloride: 104 mmol/L (ref 98–111)
Creatinine, Ser: 0.5 mg/dL (ref 0.44–1.00)
GFR calc Af Amer: >60 mL/min (ref >60)
GFR calc non Af Amer: >60 mL/min (ref >60)
Glucose, Bld: 187 mg/dL — ABNORMAL HIGH (ref 70–99)
Potassium: 5 mmol/L (ref 3.5–5.1)
Sodium: 139 mmol/L (ref 135–145)
Total Bilirubin: 0.5 mg/dL (ref 0.3–1.2)
Total Protein: 5.3 g/dL — ABNORMAL LOW (ref 6.5–8.1)

## 2019-11-27 LAB — GLUCOSE, CAPILLARY
Glucose-Capillary: 157 mg/dL — ABNORMAL HIGH (ref 70–99)
Glucose-Capillary: 259 mg/dL — ABNORMAL HIGH (ref 70–99)
Glucose-Capillary: 222 mg/dL — ABNORMAL HIGH (ref 70–99)
Glucose-Capillary: 212 mg/dL — ABNORMAL HIGH (ref 70–99)

## 2019-11-27 LAB — C-REACTIVE PROTEIN: CRP: 0.6 mg/dL (ref <1.0)

## 2019-11-27 LAB — MAGNESIUM: Magnesium: 2.5 mg/dL — ABNORMAL HIGH (ref 1.7–2.4)

## 2019-11-27 LAB — D-DIMER, QUANTITATIVE: D-Dimer, Quant: 3.42 ug/mL-FEU — ABNORMAL HIGH (ref 0.00–0.50)

## 2019-11-27 MED ORDER — SENNOSIDES-DOCUSATE SODIUM 8.6-50 MG PO TABS
1.0000 | ORAL_TABLET | Freq: Two times a day (BID) | ORAL | Status: DC
  Administered 2019-11-27 – 2019-12-01 (×8): 1 via ORAL
  Filled 2019-11-27 (×9): qty 1

## 2019-11-27 MED ORDER — METHYLPREDNISOLONE SODIUM SUCC 40 MG IJ SOLR
40.0000 mg | Freq: Two times a day (BID) | INTRAMUSCULAR | Status: DC
  Administered 2019-11-28 – 2019-12-01 (×7): 40 mg via INTRAVENOUS
  Filled 2019-11-27 (×7): qty 1

## 2019-11-27 MED ORDER — NYSTATIN 100000 UNIT/ML MT SUSP
5.0000 mL | Freq: Four times a day (QID) | OROMUCOSAL | Status: DC
  Administered 2019-11-27 – 2019-12-01 (×14): 500000 [IU] via ORAL
  Filled 2019-11-27 (×14): qty 5

## 2019-11-27 MED ORDER — METHYLPREDNISOLONE SODIUM SUCC 125 MG IJ SOLR
80.0000 mg | Freq: Two times a day (BID) | INTRAMUSCULAR | Status: AC
  Administered 2019-11-27: 80 mg via INTRAVENOUS
  Filled 2019-11-27: qty 2

## 2019-11-27 NOTE — Progress Notes (Addendum)
[facility] Progress Note  Patient: [name] [name]    YWV:[id number]  DOA: 11/18/2019     Date of Service: the patient was seen and examined on 11/27/2019  Brief hospital course: Past medical history of anxiety, depression, type II DM, GERD. Presents with complaints of cough and shortness of breath found to have COVID-19 pneumonia. Currently plan is continue supportive care for hypoxia as well as IV therapies.  Assessment and Plan: 1.Acute COVID-19 Viral Pneumonia Acute hypoxic respiratory failure POA. Sepsis secondary to pneumonia from COVID-19, POA With leukopenia, tachycardia and borderline hypotension meets SIRS criteria on admission. CXR: hazy bilateral peripheral opacities CT chest: GGO, consolidation Oxygen requirement:On 8 LPM CRP:6.2-1.7-0.7-0.6-2.8-9.4-3.3-1.1-0.6 D-dimer also trending up.  Now from 13.26-9.16-7.7-3.4  Remdesivir:Completed on 11/22/2019 Steroids:Initially on IV Solu-Medrol 75 mg twice daily transitioned to oral prednisone on 11/22/2019, back on IV Solu-Medrol due to worsening CRP.  Tapering Solu-Medrol starting 11/28/2019 to 40 mg twice daily. Baricitinib/Actemra(off-label use):Initiated on 11/22/2019. The investigational nature of this medication was discussed with the patient/HCPOA and they choose to proceed as the potential benefits are felt to outweigh risks at this time.  Antibiotics:Currently not indicated Vitamin C and Zinc:Continue DVT Prophylaxis:enoxaparin (LOVENOX) injection 40 mg Start: 11/18/19 2200 Prone positioning and incentive spirometer use recommended.  Overall plan:Continue with IV steroids in the persistent hypoxia, monitor for improvement in oxygenation  The treatment plan and use of medications and known side effects were discussed with patient/family. It was clearly explained that Complete risks and long-term side effects are unknown, however in the best clinical judgment they seem to be of some clinical benefit rather  than medical risks. Patient/family agree with the treatment plan and want to receive these treatments as indicated.  2.Type 2 diabetes mellitus, uncontrolled with hyperglycemia without complication Hemoglobin W5I 6.4. Currently elevated in the setting of steroids. Currently sliding scale.  3.Fatigue and tiredness. In the setting of COVID-19. PT consultation recommends no need for physical therapy outpatient.  4.GERD On PPI as needed.  Currently PPI schedule. Continue to monitor.  5.Epistaxis Mild, patient reports some blood clots coming out of her nose every morning. H&H stable. Continue Ocean Spray. Add Afrin spray  Diet: Regular diet DVT Prophylaxis:   enoxaparin (LOVENOX) injection 40 mg Start: 11/18/19 2200    Advance goals of care discussion: Full code  Family Communication: no family was present at bedside, at the time of interview.   Disposition:  Status is: Inpatient  Remains inpatient appropriate because:Hemodynamically unstable   Dispo: The patient is from: Home              Anticipated d/c is to: Home              Anticipated d/c date is: 3 days              Patient currently is not medically stable to d/c.  Subjective: No nausea no vomiting or no fever no chills.  Continues to have shortness of breath.  Continues to have nasal stuffiness.  Continues to have cough.  No chest pain.  Physical Exam:  General: Appear in mild distress, no Rash; Oral Mucosa Clear, moist. no Abnormal Neck Mass Or lumps, Conjunctiva normal  Cardiovascular: S1 and S2 Present, no Murmur, Respiratory: good respiratory effort, Bilateral Air entry present and bilateral  Crackles, no wheezes Abdomen: Bowel Sound present, Soft and no tenderness Extremities: no Pedal edema Neurology: alert and oriented to time, place, and person affect appropriate. no new focal deficit Gait not checked due to  patient safety concerns  Vitals:   11/27/19 0445 11/27/19 0746 11/27/19 1211  11/27/19 1309  BP: (!) 102/49   (!) 97/55  Pulse: 65 71  91  Resp: 18   17  Temp: 98.2 F (36.8 C)   97.9 F (36.6 C)  TempSrc: Oral   Oral  SpO2: 94% 90% 93% 90%  Weight:        Intake/Output Summary (Last 24 hours) at 11/27/2019 1835 Last data filed at 11/27/2019 1800 Gross per 24 hour  Intake 1200 ml  Output 1700 ml  Net -500 ml   Filed Weights   11/18/19 1156  Weight: 74.8 kg    Data Reviewed: I have personally reviewed and interpreted daily labs, tele strips, imagings as discussed above. I reviewed all nursing notes, pharmacy notes, vitals, pertinent old records I have discussed plan of care as described above with RN and patient/family.  CBC: Recent Labs  Lab 11/23/19 0857 11/24/19 0443 11/25/19 0435 11/26/19 0543 11/27/19 0632  WBC 10.4 7.5 10.4 12.7* 15.0*  NEUTROABS 8.7* 6.3 8.9* 11.0* 13.2*  HGB 12.6 12.2 11.9* 11.7* 12.0  HCT 37.6 36.8 34.7* 35.0* 35.8*  MCV 91.5 92.9 90.1 91.4 92.3  PLT 271 231 248 270 001   Basic Metabolic Panel: Recent Labs  Lab 11/21/19 0547 11/21/19 0547 11/22/19 0543 11/22/19 0543 11/23/19 0435 11/24/19 0443 11/25/19 0435 11/26/19 0543 11/27/19 0632  NA 138   < > 139   < > 137 140 137 138 139  K 4.1   < > 3.8   < > 3.5 3.8 4.2 4.4 5.0  CL 105   < > 107   < > 103 103 102 101 104  CO2 24   < > 24   < > 23 25 26 27 27   GLUCOSE 189*   < > 102*   < > 80 200* 208* 193* 187*  BUN 24*   < > 23   < > 23 25* 27* 24* 21  CREATININE 0.52   < > 0.53   < > 0.49 0.56 0.58 0.49 0.50  CALCIUM 7.9*   < > 7.7*   < > 7.8* 8.1* 7.8* 8.0* 8.3*  MG 2.4   < > 2.5*   < > 2.2 2.6* 2.6* 2.7* 2.5*  PHOS 4.3  --  3.5  --  3.8  --   --   --   --    < > = values in this interval not displayed.    Studies: No results found.  Scheduled Meds: . vitamin C  500 mg Oral Daily  . baricitinib  4 mg Oral Daily  . calcium carbonate  1 tablet Oral Q breakfast  . cholecalciferol  1,000 Units Oral Daily  . enoxaparin (LOVENOX) injection  40 mg  Subcutaneous Q24H  . feeding supplement (GLUCERNA SHAKE)  237 mL Oral TID BM  . insulin aspart  0-15 Units Subcutaneous TID WC  . Ipratropium-Albuterol  1 puff Inhalation TID  . loratadine  10 mg Oral QHS  . methylPREDNISolone (SOLU-MEDROL) injection  80 mg Intravenous BID  . [START ON 11/28/2019] methylPREDNISolone (SOLU-MEDROL) injection  40 mg Intravenous BID  . nystatin  5 mL Oral QID  . oxymetazoline  1 spray Each Nare BID  . pantoprazole  40 mg Oral QAC breakfast  . senna-docusate  1 tablet Oral BID  . zinc sulfate  220 mg Oral Daily   Continuous Infusions: PRN Meds: acetaminophen, albuterol, alum & mag hydroxide-simeth, chlorpheniramine-HYDROcodone, fluticasone, guaiFENesin-dextromethorphan,  menthol-cetylpyridinium, ondansetron **OR** ondansetron (ZOFRAN) IV, polyethylene glycol, sodium chloride, traMADol, traZODone  Time spent: 35 minutes  Author: Berle Mull, MD [facility] 11/27/2019 6:35 PM  To reach On-call, see care teams to locate the attending and reach out via [email or web address]. Between 7PM-7AM, please contact night-coverage If you still have difficulty reaching the attending provider, please page the [facility] (Director on Call) for [facility] on amion for assistance.

## 2019-11-28 LAB — COMPREHENSIVE METABOLIC PANEL
ALT: 31 U/L (ref 0–44)
AST: 18 U/L (ref 15–41)
Albumin: 2.5 g/dL — ABNORMAL LOW (ref 3.5–5.0)
Alkaline Phosphatase: 40 U/L (ref 38–126)
Anion gap: 9 (ref 5–15)
BUN: 20 mg/dL (ref 8–23)
CO2: 26 mmol/L (ref 22–32)
Calcium: 8.2 mg/dL — ABNORMAL LOW (ref 8.9–10.3)
Chloride: 103 mmol/L (ref 98–111)
Creatinine, Ser: 0.55 mg/dL (ref 0.44–1.00)
GFR calc Af Amer: >60 mL/min (ref >60)
GFR calc non Af Amer: >60 mL/min (ref >60)
Glucose, Bld: 207 mg/dL — ABNORMAL HIGH (ref 70–99)
Potassium: 4.6 mmol/L (ref 3.5–5.1)
Sodium: 138 mmol/L (ref 135–145)
Total Bilirubin: 0.4 mg/dL (ref 0.3–1.2)
Total Protein: 5.4 g/dL — ABNORMAL LOW (ref 6.5–8.1)

## 2019-11-28 LAB — CBC WITH DIFFERENTIAL/PLATELET
Abs Immature Granulocytes: 0.31 10*3/uL — ABNORMAL HIGH (ref 0.00–0.07)
Basophils Absolute: 0 10*3/uL (ref 0.0–0.1)
Basophils Relative: 0 %
Eosinophils Absolute: 0 10*3/uL (ref 0.0–0.5)
Eosinophils Relative: 0 %
HCT: 36.3 % (ref 36.0–46.0)
Hemoglobin: 12 g/dL (ref 12.0–15.0)
Immature Granulocytes: 2 %
Lymphocytes Relative: 5 %
Lymphs Abs: 0.8 10*3/uL (ref 0.7–4.0)
MCH: 30.7 pg (ref 26.0–34.0)
MCHC: 33.1 g/dL (ref 30.0–36.0)
MCV: 92.8 fL (ref 80.0–100.0)
Monocytes Absolute: 0.6 10*3/uL (ref 0.1–1.0)
Monocytes Relative: 4 %
Neutro Abs: 12.9 10*3/uL — ABNORMAL HIGH (ref 1.7–7.7)
Neutrophils Relative %: 89 %
Platelets: 241 10*3/uL (ref 150–400)
RBC: 3.91 MIL/uL (ref 3.87–5.11)
RDW: 12.9 % (ref 11.5–15.5)
WBC: 14.6 10*3/uL — ABNORMAL HIGH (ref 4.0–10.5)
nRBC: 0 % (ref 0.0–0.2)

## 2019-11-28 LAB — GLUCOSE, CAPILLARY
Glucose-Capillary: 155 mg/dL — ABNORMAL HIGH (ref 70–99)
Glucose-Capillary: 237 mg/dL — ABNORMAL HIGH (ref 70–99)
Glucose-Capillary: 199 mg/dL — ABNORMAL HIGH (ref 70–99)
Glucose-Capillary: 260 mg/dL — ABNORMAL HIGH (ref 70–99)

## 2019-11-28 LAB — MAGNESIUM: Magnesium: 2.4 mg/dL (ref 1.7–2.4)

## 2019-11-28 LAB — D-DIMER, QUANTITATIVE: D-Dimer, Quant: 3.78 ug/mL-FEU — ABNORMAL HIGH (ref 0.00–0.50)

## 2019-11-28 LAB — C-REACTIVE PROTEIN: CRP: 0.5 mg/dL (ref <1.0)

## 2019-11-28 MED ORDER — PANTOPRAZOLE SODIUM 40 MG PO TBEC
40.0000 mg | DELAYED_RELEASE_TABLET | Freq: Every day | ORAL | Status: DC
  Administered 2019-11-29 – 2019-12-01 (×3): 40 mg via ORAL
  Filled 2019-11-28 (×3): qty 1

## 2019-11-28 MED ORDER — OXYMETAZOLINE HCL 0.05 % NA SOLN
1.0000 | Freq: Two times a day (BID) | NASAL | Status: AC
  Administered 2019-11-28 – 2019-12-01 (×6): 1 via NASAL

## 2019-11-28 NOTE — Progress Notes (Signed)
[facility] Progress Note  Patient: [name] [name]    QBH:[id number]  DOA: 11/18/2019     Date of Service: the patient was seen and examined on 11/28/2019  Brief hospital course: Past medical history of anxiety, depression, type II DM, GERD. Presents with complaints of cough and shortness of breath found to have COVID-19 pneumonia. Currently plan is continue supportive care for hypoxia as well as IV therapies.  Assessment and Plan: 1.Acute COVID-19 Viral Pneumonia Acute hypoxic respiratory failure POA. Sepsis secondary to pneumonia from COVID-19, POA With leukopenia, tachycardia and borderline hypotension meets SIRS criteria on admission. CXR: hazy bilateral peripheral opacities CT chest: GGO, consolidation Oxygen requirement:On 9 LPM CRP:6.2-1.7-0.7-0.6-2.8-9.4-3.3-1.1-0.6 D-dimer also trending up.  Now from 13.26-9.16-7.7-3.4  Remdesivir:Completed on 11/22/2019 Steroids:Initially on IV Solu-Medrol 75 mg twice daily transitioned to oral prednisone on 11/22/2019, back on IV Solu-Medrol due to worsening CRP.  Tapering Solu-Medrol starting 11/28/2019 to 40 mg twice daily. Baricitinib/Actemra(off-label use):Initiated on 11/22/2019. The investigational nature of this medication was discussed with the patient/HCPOA and they choose to proceed as the potential benefits are felt to outweigh risks at this time.  Antibiotics:Currently not indicated Vitamin C and Zinc:Continue DVT Prophylaxis:enoxaparin (LOVENOX) injection 40 mg Start: 11/18/19 2200 Prone positioning and incentive spirometer use recommended.  Overall plan:Continue with IV steroids in the persistent hypoxia, monitor for improvement in oxygenation  The treatment plan and use of medications and known side effects were discussed with patient/family. It was clearly explained that Complete risks and long-term side effects are unknown, however in the best clinical judgment they seem to be of some clinical benefit rather  than medical risks. Patient/family agree with the treatment plan and want to receive these treatments as indicated.  2.Type 2 diabetes mellitus, uncontrolled with hyperglycemia without complication Hemoglobin O9B 6.4. Currently elevated in the setting of steroids. Currently sliding scale.  3.Fatigue and tiredness. In the setting of COVID-19. PT consultation recommends no need for physical therapy outpatient.  4.GERD On PPI as needed.  Currently PPI schedule. Continue to monitor.  5.Epistaxis Mild, patient reports some blood clots coming out of her nose every morning. H&H stable. Continue Ocean Spray. Add Afrin spray  6.  Oral thrush. Nystatin  Diet: Regular diet DVT Prophylaxis:   enoxaparin (LOVENOX) injection 40 mg Start: 11/18/19 2200    Advance goals of care discussion: Full code  Family Communication: no family was present at bedside, at the time of interview.   Disposition:  Status is: Inpatient  Remains inpatient appropriate because:Hemodynamically unstable   Dispo: The patient is from: Home              Anticipated d/c is to: Home              Anticipated d/c date is: 3 days              Patient currently is not medically stable to d/c.  Subjective: Continues to report fatigue and tiredness.  No nausea no vomiting.  No fever no chills.  Physical Exam:  General: Appear in mild distress, no Rash; Oral Mucosa shows Thrush. no Abnormal Neck Mass Or lumps, Conjunctiva normal  Cardiovascular: S1 and S2 Present, no Murmur, Respiratory: increased respiratory effort, Bilateral Air entry present and no Crackles, no wheezes Abdomen: Bowel Sound present, Soft and no tenderness Extremities: no Pedal edema Neurology: alert and oriented to time, place, and person affect appropriate. no new focal deficit Gait not checked due to patient safety concerns  Vitals:   11/27/19 2012 11/27/19  2126 11/28/19 0457 11/28/19 1613  BP:  111/65 (!) 107/45 (!) 94/58    Pulse:  75 61 69  Resp: 20 19 17 19   Temp:  99.3 F (37.4 C) 98.9 F (37.2 C) 98.2 F (36.8 C)  TempSrc:  Oral Oral   SpO2: 95% 94% 94% 99%  Weight:        Intake/Output Summary (Last 24 hours) at 11/28/2019 1855 Last data filed at 11/28/2019 1300 Gross per 24 hour  Intake 360 ml  Output 1150 ml  Net -790 ml   Filed Weights   11/18/19 1156  Weight: 74.8 kg    Data Reviewed: I have personally reviewed and interpreted daily labs, tele strips, imagings as discussed above. I reviewed all nursing notes, pharmacy notes, vitals, pertinent old records I have discussed plan of care as described above with RN and patient/family.  CBC: Recent Labs  Lab 11/24/19 0443 11/25/19 0435 11/26/19 0543 11/27/19 0632 11/28/19 0505  WBC 7.5 10.4 12.7* 15.0* 14.6*  NEUTROABS 6.3 8.9* 11.0* 13.2* 12.9*  HGB 12.2 11.9* 11.7* 12.0 12.0  HCT 36.8 34.7* 35.0* 35.8* 36.3  MCV 92.9 90.1 91.4 92.3 92.8  PLT 231 248 270 257 660   Basic Metabolic Panel: Recent Labs  Lab 11/22/19 0543 11/22/19 0543 11/23/19 0435 11/23/19 0435 11/24/19 0443 11/25/19 0435 11/26/19 0543 11/27/19 0632 11/28/19 0505  NA 139   < > 137   < > 140 137 138 139 138  K 3.8   < > 3.5   < > 3.8 4.2 4.4 5.0 4.6  CL 107   < > 103   < > 103 102 101 104 103  CO2 24   < > 23   < > 25 26 27 27 26   GLUCOSE 102*   < > 80   < > 200* 208* 193* 187* 207*  BUN 23   < > 23   < > 25* 27* 24* 21 20  CREATININE 0.53   < > 0.49   < > 0.56 0.58 0.49 0.50 0.55  CALCIUM 7.7*   < > 7.8*   < > 8.1* 7.8* 8.0* 8.3* 8.2*  MG 2.5*   < > 2.2   < > 2.6* 2.6* 2.7* 2.5* 2.4  PHOS 3.5  --  3.8  --   --   --   --   --   --    < > = values in this interval not displayed.    Studies: No results found.  Scheduled Meds: . vitamin C  500 mg Oral Daily  . baricitinib  4 mg Oral Daily  . calcium carbonate  1 tablet Oral Q breakfast  . cholecalciferol  1,000 Units Oral Daily  . enoxaparin (LOVENOX) injection  40 mg Subcutaneous Q24H  . feeding  supplement (GLUCERNA SHAKE)  237 mL Oral TID BM  . insulin aspart  0-15 Units Subcutaneous TID WC  . Ipratropium-Albuterol  1 puff Inhalation TID  . loratadine  10 mg Oral QHS  . methylPREDNISolone (SOLU-MEDROL) injection  40 mg Intravenous BID  . nystatin  5 mL Oral QID  . pantoprazole  40 mg Oral QAC breakfast  . senna-docusate  1 tablet Oral BID  . zinc sulfate  220 mg Oral Daily   Continuous Infusions: PRN Meds: acetaminophen, albuterol, alum & mag hydroxide-simeth, chlorpheniramine-HYDROcodone, fluticasone, guaiFENesin-dextromethorphan, menthol-cetylpyridinium, ondansetron **OR** ondansetron (ZOFRAN) IV, polyethylene glycol, sodium chloride, traMADol, traZODone  Time spent: 35 minutes  Author: Berle Mull, MD [facility] 11/28/2019 6:55  PM  To reach On-call, see care teams to locate the attending and reach out via [email or web address]. Between 7PM-7AM, please contact night-coverage If you still have difficulty reaching the attending provider, please page the [facility] (Director on Call) for [facility] on amion for assistance.

## 2019-11-28 NOTE — Progress Notes (Signed)
Physical Therapy Treatment Patient Details Name: [name] [name] MRN: [id number] DOB: [birth year] Today's Date: 11/28/2019    History of Present Illness Pt admitted with SOB 2* COVID PNA and with hx of DM and anxiety/depression    PT Comments    Pt progressing toward goals. amb 15' x2 and performed ex's.  SpO2=91-82-90% on 8L. pt removing O2 at times while resting. Continue PT POC    Follow Up Recommendations  Home health PT     Equipment Recommendations  None recommended by PT    Recommendations for Other Services       Precautions / Restrictions Precautions Precautions: Fall Precaution Comments: Monitor O2 Restrictions Weight Bearing Restrictions: No    Mobility  Bed Mobility Overal bed mobility: Needs Assistance Bed Mobility: Supine to Sit     Supine to sit: Supervision     General bed mobility comments: for safety, HOB elevated  Transfers Overall transfer level: Needs assistance Equipment used: Rolling walker (2 wheeled) (simulated with O2 carrier/holder) Transfers: Sit to/from Stand Sit to Stand: Min guard;Min assist         General transfer comment: light assist to rise and steady, min assist from lower surface  Ambulation/Gait Ambulation/Gait assistance: Min guard Gait Distance (Feet): 15 Feet (x2) Assistive device: Rolling walker (2 wheeled) Gait Pattern/deviations: Step-through pattern;Decreased stride length;Trunk flexed Gait velocity: decreased   General Gait Details: cues for trunk extension, proper breathing, min/guard for safety    Stairs             Wheelchair Mobility    Modified Rankin (Stroke Patients Only)       Balance     Sitting balance-Leahy Scale: Good     Standing balance support: Bilateral upper extremity supported;Single extremity supported Standing balance-Leahy Scale: Fair                              Cognition Arousal/Alertness: Awake/alert Behavior During Therapy: WFL for tasks  assessed/performed;Flat affect Overall Cognitive Status: Within Functional Limits for tasks assessed                                 General Comments: SpO2=91-82-90% on 8L. pt removing O2 at times while resting.       Exercises General Exercises - Lower Extremity Long Arc Quad: AROM;Both;15 reps;Seated Mini-Sqauts: Both;AROM;Seated;Standing;5 reps (sit to stand, emphasis on decr speed)    General Comments        Pertinent Vitals/Pain Pain Assessment: No/denies pain    Home Living                      Prior Function            PT Goals (current goals can now be found in the care plan section) Acute Rehab PT Goals Patient Stated Goal: Breathe better and go home PT Goal Formulation: With patient Time For Goal Achievement: 12/08/19 Potential to Achieve Goals: Good Progress towards PT goals: Progressing toward goals    Frequency    Min 3X/week      PT Plan Current plan remains appropriate    Co-evaluation              AM-PAC PT "6 Clicks" Mobility   Outcome Measure  Help needed turning from your back to your side while in a flat bed without using bedrails?: A Little Help needed moving from lying  on your back to sitting on the side of a flat bed without using bedrails?: A Little Help needed moving to and from a bed to a chair (including a wheelchair)?: A Little Help needed standing up from a chair using your arms (e.g., wheelchair or bedside chair)?: A Little Help needed to walk in hospital room?: A Little Help needed climbing 3-5 steps with a railing? : A Lot 6 Click Score: 17    End of Session Equipment Utilized During Treatment: Gait belt;Oxygen Activity Tolerance: Patient limited by fatigue Patient left: in chair;with call bell/phone within reach;with chair alarm set Nurse Communication: Mobility status PT Visit Diagnosis: Unsteadiness on feet (R26.81);Muscle weakness (generalized) (M62.81);Difficulty in walking, not elsewhere  classified (R26.2)     Time: 3614-4315 PT Time Calculation (min) (ACUTE ONLY): 27 min  Charges:  $Gait Training: 8-22 mins $Therapeutic Activity: 8-22 mins                     Baxter Flattery, PT  Acute Rehab Dept ([place]) [phone] Pager [phone]  11/28/2019    [facility] 11/28/2019, 12:34 PM

## 2019-11-28 NOTE — TOC Progression Note (Signed)
Transition of Care [facility]) - Progression Note    Patient Details  Name: [name] [name] MRN: [id number] Date of Birth: 03-01-1951  [facility] of [facility] CM/SW Contact  Joaquin Courts, RN Phone Number: 11/28/2019, 12:22 PM  Clinical Narrative:   CM spoke with patient's spouse regarding recommendation for HHPT.  Patient set up with HHPT with [facility].    Expected Discharge Plan: [place] Barriers to Discharge: Continued Medical Work up  Expected Discharge Plan and Services Expected Discharge Plan: [place]   Discharge Planning Services: CM Consult Post Acute Care Choice: [place] arrangements for the past 2 months: [place]: PT [place]: [facility] Date [place]: 11/28/19 Time [place]: 1221 Representative spoke with at [place]: [place]   Social Determinants of Health ([place]) Interventions    Readmission Risk Interventions No flowsheet data found.

## 2019-11-29 ENCOUNTER — Inpatient Hospital Stay (HOSPITAL_COMMUNITY): Payer: PPO

## 2019-11-29 LAB — CBC WITH DIFFERENTIAL/PLATELET
Abs Immature Granulocytes: 0.46 10*3/uL — ABNORMAL HIGH (ref 0.00–0.07)
Basophils Absolute: 0 10*3/uL (ref 0.0–0.1)
Basophils Relative: 0 %
Eosinophils Absolute: 0 10*3/uL (ref 0.0–0.5)
Eosinophils Relative: 0 %
HCT: 35.6 % — ABNORMAL LOW (ref 36.0–46.0)
Hemoglobin: 12 g/dL (ref 12.0–15.0)
Immature Granulocytes: 3 %
Lymphocytes Relative: 6 %
Lymphs Abs: 1.1 10*3/uL (ref 0.7–4.0)
MCH: 31.2 pg (ref 26.0–34.0)
MCHC: 33.7 g/dL (ref 30.0–36.0)
MCV: 92.5 fL (ref 80.0–100.0)
Monocytes Absolute: 0.9 10*3/uL (ref 0.1–1.0)
Monocytes Relative: 5 %
Neutro Abs: 15.8 10*3/uL — ABNORMAL HIGH (ref 1.7–7.7)
Neutrophils Relative %: 86 %
Platelets: 219 10*3/uL (ref 150–400)
RBC: 3.85 MIL/uL — ABNORMAL LOW (ref 3.87–5.11)
RDW: 12.9 % (ref 11.5–15.5)
WBC: 18.4 10*3/uL — ABNORMAL HIGH (ref 4.0–10.5)
nRBC: 0 % (ref 0.0–0.2)

## 2019-11-29 LAB — D-DIMER, QUANTITATIVE: D-Dimer, Quant: 3 ug/mL-FEU — ABNORMAL HIGH (ref 0.00–0.50)

## 2019-11-29 LAB — GLUCOSE, CAPILLARY
Glucose-Capillary: 120 mg/dL — ABNORMAL HIGH (ref 70–99)
Glucose-Capillary: 107 mg/dL — ABNORMAL HIGH (ref 70–99)
Glucose-Capillary: 280 mg/dL — ABNORMAL HIGH (ref 70–99)
Glucose-Capillary: 154 mg/dL — ABNORMAL HIGH (ref 70–99)

## 2019-11-29 LAB — MAGNESIUM: Magnesium: 2.4 mg/dL (ref 1.7–2.4)

## 2019-11-29 LAB — C-REACTIVE PROTEIN: CRP: 0.5 mg/dL (ref <1.0)

## 2019-11-29 MED ORDER — LACTULOSE 10 GM/15ML PO SOLN
20.0000 g | Freq: Two times a day (BID) | ORAL | Status: AC
  Administered 2019-11-29 (×2): 20 g via ORAL
  Filled 2019-11-29 (×2): qty 30

## 2019-11-29 MED ORDER — FLUTICASONE PROPIONATE 50 MCG/ACT NA SUSP
2.0000 | Freq: Every day | NASAL | Status: DC
  Administered 2019-11-29 – 2019-12-01 (×3): 2 via NASAL
  Filled 2019-11-29: qty 16

## 2019-11-29 MED ORDER — IPRATROPIUM-ALBUTEROL 20-100 MCG/ACT IN AERS: 1.0000 | INHALATION_SPRAY | Freq: Three times a day (TID) | RESPIRATORY_TRACT | 0 refills | Status: DC

## 2019-11-29 MED ORDER — POLYETHYLENE GLYCOL 3350 17 G PO PACK: 17.0000 g | PACK | Freq: Every day | ORAL | 0 refills | Status: DC | PRN

## 2019-11-29 MED ORDER — ASCORBIC ACID 500 MG PO TABS: 500.0000 mg | ORAL_TABLET | Freq: Every day | ORAL | 0 refills | Status: AC

## 2019-11-29 MED ORDER — NYSTATIN 100000 UNIT/ML MT SUSP: 5.0000 mL | Freq: Four times a day (QID) | OROMUCOSAL | 0 refills | Status: DC

## 2019-11-29 MED ORDER — PREDNISONE 10 MG PO TABS: ORAL_TABLET | ORAL | 0 refills | Status: DC

## 2019-11-29 NOTE — Progress Notes (Addendum)
[facility] Progress Note  Patient: [name] [name]    YTK:[id number]  DOA: 11/18/2019     Date of Service: the patient was seen and examined on 11/29/2019  Brief hospital course: Past medical history of anxiety, depression, type II DM, GERD. Presents with complaints of cough and shortness of breath found to have COVID-19 pneumonia. Currently plan is continue supportive care for hypoxia.  Assessment and Plan: 1.Acute COVID-19 Viral Pneumonia Acute hypoxic respiratory failure POA. Sepsis secondary to pneumonia from COVID-19, POA With leukopenia, tachycardia and borderline hypotension meets SIRS criteria on admission. CXR: hazy bilateral peripheral opacities CT chest: GGO, consolidation Oxygen requirement:On 6 LPM CRP:6.2-1.7-0.7-0.6-2.8-9.4-3.3-1.1-0.6 D-dimer also trending up.  Now from 13.26-9.16-7.7-3.4  Remdesivir:Completed on 11/22/2019 Steroids:Initially on IV Solu-Medrol 75 mg twice daily transitioned to oral prednisone on 11/22/2019, back on IV Solu-Medrol due to worsening CRP.  Tapering Solu-Medrol starting 11/28/2019 to 40 mg twice daily. Baricitinib/Actemra(off-label use):Initiated on 11/22/2019. The investigational nature of this medication was discussed with the patient/HCPOA and they choose to proceed as the potential benefits are felt to outweigh risks at this time.  Antibiotics:Currently not indicated Vitamin C and Zinc:Continue DVT Prophylaxis:enoxaparin (LOVENOX) injection 40 mg Start: 11/18/19 2200 Prone positioning and incentive spirometer use recommended.  Overall plan:Home oxygen evaluation on 11/30/2019 along with plan for potential discharge with home with home health as long as oxygenation stays less than 6 L on exertion.  The treatment plan and use of medications and known side effects were discussed with patient/family. It was clearly explained that Complete risks and long-term side effects are unknown, however in the best clinical judgment they  seem to be of some clinical benefit rather than medical risks. Patient/family agree with the treatment plan and want to receive these treatments as indicated.  2.Type 2 diabetes mellitus, uncontrolled with hyperglycemia without complication Hemoglobin X5T 6.4. Currently elevated in the setting of steroids. Currently sliding scale.  3.Fatigue and tiredness. In the setting of COVID-19. PT consultation recommends no need for physical therapy outpatient.  4.GERD On PPI as needed.  Currently PPI schedule. Continue to monitor.  5.Epistaxis Mild, patient reports some blood clots coming out of her nose every morning. H&H stable. Continue Ocean Spray. Add Afrin spray X-ray sinus does not show any evidence of significant sinusitis. Continue Flonase on discharge.  6.  Oral thrush. Nystatin  7.  Constipation. Continue bowel regimen.  Diet: Regular diet DVT Prophylaxis:   enoxaparin (LOVENOX) injection 40 mg Start: 11/18/19 2200    Advance goals of care discussion: Full code  Family Communication: no family was present at bedside, at the time of interview.  Discussed with husband on the phone. All questions answered satisfactorily. Husband would require transportation assistance. Husband would prefer medication refill at outpatient pharmacy. Husband requested to have access to patient's medical chart for insurance purposes and I directed him to my chart as well as call medical records tomorrow.  Disposition:  Status is: Inpatient  Remains inpatient appropriate because:Hemodynamically unstable   Dispo: The patient is from: Home              Anticipated d/c is to: Home              Anticipated d/c date is: Potentially able to discharge on 11/30/2019 depending on home oxygen evaluation.              Patient currently is not medically stable to d/c.  Subjective: Feeling better.  No nausea or vomiting.  No chest pain.  Nasal stuffiness  is also improving.  No hemoptysis  no epistaxis.  Physical Exam:  General: Appear in mild distress, no Rash; Oral Mucosa shows Thrush. no Abnormal Neck Mass Or lumps, Conjunctiva normal  Cardiovascular: S1 and S2 Present, no Murmur, Respiratory: increased respiratory effort, Bilateral Air entry present and no Crackles, no wheezes Abdomen: Bowel Sound present, Soft and no tenderness Extremities: no Pedal edema Neurology: alert and oriented to time, place, and person affect appropriate. no new focal deficit Gait not checked due to patient safety concerns  Vitals:   11/28/19 2000 11/29/19 0432 11/29/19 1403 11/29/19 1838  BP: 119/69 (!) 90/53 (!) 98/59   Pulse: 68 60 71   Resp: 18 19 16    Temp: 98.5 F (36.9 C) 98.8 F (37.1 C) 98.2 F (36.8 C)   TempSrc: Oral  Oral   SpO2: 97% 97% 96% 98%  Weight:        Intake/Output Summary (Last 24 hours) at 11/29/2019 2059 Last data filed at 11/29/2019 1700 Gross per 24 hour  Intake 355 ml  Output 2150 ml  Net -1795 ml   Filed Weights   11/18/19 1156  Weight: 74.8 kg    Data Reviewed: I have personally reviewed and interpreted daily labs, tele strips, imagings as discussed above. I reviewed all nursing notes, pharmacy notes, vitals, pertinent old records I have discussed plan of care as described above with RN and patient/family.  CBC: Recent Labs  Lab 11/25/19 0435 11/26/19 0543 11/27/19 0632 11/28/19 0505 11/29/19 0415  WBC 10.4 12.7* 15.0* 14.6* 18.4*  NEUTROABS 8.9* 11.0* 13.2* 12.9* 15.8*  HGB 11.9* 11.7* 12.0 12.0 12.0  HCT 34.7* 35.0* 35.8* 36.3 35.6*  MCV 90.1 91.4 92.3 92.8 92.5  PLT 248 270 257 241 786   Basic Metabolic Panel: Recent Labs  Lab 11/23/19 0435 11/23/19 0435 11/24/19 0443 11/24/19 0443 11/25/19 0435 11/26/19 0543 11/27/19 0632 11/28/19 0505 11/29/19 0415  NA 137   < > 140  --  137 138 139 138  --   K 3.5   < > 3.8  --  4.2 4.4 5.0 4.6  --   CL 103   < > 103  --  102 101 104 103  --   CO2 23   < > 25  --  26 27 27 26   --     GLUCOSE 80   < > 200*  --  208* 193* 187* 207*  --   BUN 23   < > 25*  --  27* 24* 21 20  --   CREATININE 0.49   < > 0.56  --  0.58 0.49 0.50 0.55  --   CALCIUM 7.8*   < > 8.1*  --  7.8* 8.0* 8.3* 8.2*  --   MG 2.2   < > 2.6*   < > 2.6* 2.7* 2.5* 2.4 2.4  PHOS 3.8  --   --   --   --   --   --   --   --    < > = values in this interval not displayed.    Studies: DG Sinuses Complete  Result Date: 11/29/2019 CLINICAL DATA:  Sinus region pain.  COVID-19 positive EXAM: PARANASAL SINUSES - COMPLETE 3 + VIEW COMPARISON:  None. FINDINGS: Upright frontal, lateral, [place], and submental vertex images were obtained. There is an apparent air-fluid level in the right maxillary antrum. Hazy opacity is noted in the inferior right frontal sinus region. Other paranasal sinuses and mastoids are clear. No bony  destruction or expansion. IMPRESSION: Air-fluid level seen in the right maxillary antrum consistent with sinusitis. Ill-defined hazy opacity in inferior right frontal sinus also felt to be indicative of a degree of sinusitis. No bony abnormality. Electronically Signed   By: Lowella Grip III M.D.   On: 11/29/2019 09:18   DG Abd Portable 1V  Result Date: 11/29/2019 CLINICAL DATA:  Abdominal distension, constipation EXAM: PORTABLE ABDOMEN - 1 VIEW COMPARISON:  None. FINDINGS: The bowel gas pattern is normal. No radio-opaque calculi or other significant radiographic abnormality are seen. Mild to moderate volume colonic stool burden. IMPRESSION: 1. Nonobstructive bowel gas pattern. 2. Mild to moderate volume colonic stool burden. Electronically Signed   By: Davina Poke D.O.   On: 11/29/2019 11:21    Scheduled Meds: . vitamin C  500 mg Oral Daily  . baricitinib  4 mg Oral Daily  . calcium carbonate  1 tablet Oral Q breakfast  . cholecalciferol  1,000 Units Oral Daily  . enoxaparin (LOVENOX) injection  40 mg Subcutaneous Q24H  . feeding supplement (GLUCERNA SHAKE)  237 mL Oral TID BM  . fluticasone   2 spray Each Nare Daily  . insulin aspart  0-15 Units Subcutaneous TID WC  . Ipratropium-Albuterol  1 puff Inhalation TID  . loratadine  10 mg Oral QHS  . methylPREDNISolone (SOLU-MEDROL) injection  40 mg Intravenous BID  . nystatin  5 mL Oral QID  . oxymetazoline  1 spray Each Nare BID  . pantoprazole  40 mg Oral QAC breakfast  . senna-docusate  1 tablet Oral BID  . zinc sulfate  220 mg Oral Daily   Continuous Infusions: PRN Meds: acetaminophen, albuterol, alum & mag hydroxide-simeth, chlorpheniramine-HYDROcodone, guaiFENesin-dextromethorphan, menthol-cetylpyridinium, ondansetron **OR** ondansetron (ZOFRAN) IV, polyethylene glycol, sodium chloride, traMADol, traZODone  Time spent: 35 minutes  Author: Berle Mull, MD [facility] 11/29/2019 8:59 PM  To reach On-call, see care teams to locate the attending and reach out via [email or web address]. Between 7PM-7AM, please contact night-coverage If you still have difficulty reaching the attending provider, please page the [facility] (Director on Call) for [facility] on amion for assistance.

## 2019-11-29 NOTE — Care Management Important Message (Signed)
Important Message  Patient Details IM Letter given to the Patient Name: [name] [name] MRN: [id number] Date of Birth: March 28, 1950   Medicare Important Message Given:  Yes     Kerin Salen 11/29/2019, 10:55 AM

## 2019-11-29 NOTE — Plan of Care (Signed)
  Problem: Skin Integrity: Goal: Risk for impaired skin integrity will decrease Outcome: Progressing   Problem: Safety: Goal: Ability to remain free from injury will improve Outcome: Progressing   Problem: Pain Managment: Goal: General experience of comfort will improve Outcome: Progressing   Problem: Elimination: Goal: Will not experience complications related to bowel motility Outcome: Progressing   Problem: Coping: Goal: Level of anxiety will decrease Outcome: Progressing   Problem: Nutrition: Goal: Adequate nutrition will be maintained Outcome: Progressing

## 2019-11-30 LAB — CBC WITH DIFFERENTIAL/PLATELET
Abs Immature Granulocytes: 0.26 10*3/uL — ABNORMAL HIGH (ref 0.00–0.07)
Basophils Absolute: 0 10*3/uL (ref 0.0–0.1)
Basophils Relative: 0 %
Eosinophils Absolute: 0 10*3/uL (ref 0.0–0.5)
Eosinophils Relative: 0 %
HCT: 35.4 % — ABNORMAL LOW (ref 36.0–46.0)
Hemoglobin: 11.9 g/dL — ABNORMAL LOW (ref 12.0–15.0)
Immature Granulocytes: 2 %
Lymphocytes Relative: 7 %
Lymphs Abs: 1 10*3/uL (ref 0.7–4.0)
MCH: 31 pg (ref 26.0–34.0)
MCHC: 33.6 g/dL (ref 30.0–36.0)
MCV: 92.2 fL (ref 80.0–100.0)
Monocytes Absolute: 0.7 10*3/uL (ref 0.1–1.0)
Monocytes Relative: 5 %
Neutro Abs: 12.8 10*3/uL — ABNORMAL HIGH (ref 1.7–7.7)
Neutrophils Relative %: 86 %
Platelets: 265 10*3/uL (ref 150–400)
RBC: 3.84 MIL/uL — ABNORMAL LOW (ref 3.87–5.11)
RDW: 12.7 % (ref 11.5–15.5)
WBC: 14.8 10*3/uL — ABNORMAL HIGH (ref 4.0–10.5)
nRBC: 0 % (ref 0.0–0.2)

## 2019-11-30 LAB — GLUCOSE, CAPILLARY
Glucose-Capillary: 124 mg/dL — ABNORMAL HIGH (ref 70–99)
Glucose-Capillary: 201 mg/dL — ABNORMAL HIGH (ref 70–99)
Glucose-Capillary: 180 mg/dL — ABNORMAL HIGH (ref 70–99)
Glucose-Capillary: 125 mg/dL — ABNORMAL HIGH (ref 70–99)

## 2019-11-30 LAB — C-REACTIVE PROTEIN: CRP: 0.5 mg/dL (ref <1.0)

## 2019-11-30 LAB — D-DIMER, QUANTITATIVE: D-Dimer, Quant: 3.64 ug/mL-FEU — ABNORMAL HIGH (ref 0.00–0.50)

## 2019-11-30 LAB — MAGNESIUM: Magnesium: 2.3 mg/dL (ref 1.7–2.4)

## 2019-11-30 MED ORDER — ALBUTEROL SULFATE HFA 108 (90 BASE) MCG/ACT IN AERS: 2.0000 | INHALATION_SPRAY | RESPIRATORY_TRACT | 1 refills | Status: AC | PRN

## 2019-11-30 MED FILL — ALBUTEROL SULFATE HFA 108 (: 108 (90 BAS | 16 days supply | Qty: 9 | Fill #0

## 2019-11-30 MED FILL — predniSONE 10 MG TABS: 10 | 15 days supply | Qty: 45 | Fill #0

## 2019-11-30 MED FILL — NYSTATIN 100,000 UNITS/ML S: 100000 | 3 days supply | Qty: 60 | Fill #0

## 2019-11-30 NOTE — Progress Notes (Signed)
PROGRESS NOTE    DOCIE ABRAMOVICH  RSW:[id number] DOB: April 07, 1950 DOA: 11/18/2019 PCP: Ardith Dark, PA-C    Brief Narrative:  69 year old female with history of asthma, type 2 diabetes, GERD, known Covid exposure, positive test on 9/13. Admitted with about 10 days of symptoms body ache, cough sore throat and congestion. Went to [employer] noted to be hypoxic, chest x-ray with bilateral pneumonia. Patient is unvaccinated. Was using ivermectin before coming to the hospital.  Assessment & Plan:   Principal Problem:   Acute hypoxemic respiratory failure due to COVID-19 [facility]) Active Problems:   Allergic rhinitis   GERD   Pneumonia due to COVID-19 virus   Type 2 diabetes mellitus (HCC)   Acute respiratory failure with hypoxia (HCC)   Diabetes mellitus type 2, controlled, without complications (HCC)  Acute hypoxemic respiratory failure with COVID-19 pneumonia: Continue to monitor due to significant symptoms  chest physiotherapy, incentive spirometry, deep breathing exercises, sputum induction, mucolytic's and bronchodilators. Supplemental oxygen to keep saturations more than 90%. Covid directed therapy with , steroids, currently on Solu-Medrol remdesivir, finished 5 days of therapy, actemra/baricitinib, patient on baricitinib for the day 9/14 or until hospital discharge Due to severity of symptoms, patient will need daily inflammatory markers, chest x-rays, liver function test to monitor and direct COVID-19 therapies. Patient had greatly elevated D-dimer, negative CTA of the chest.  D-dimer subsequently coming down but not normalized yet.  SpO2: (!) 86 % O2 Flow Rate (L/min): 3 L/min  Potential discharge with oxygen tomorrow.  COVID-19 Labs  Recent Labs    11/28/19 0505 11/29/19 0415 11/30/19 0407  DDIMER 3.78* 3.00* 3.64*  CRP 0.5 0.5 0.5    No results found for: SARSCOV2NAA  Type 2 diabetes, uncontrolled with hyperglycemia: A1c 6.4. Aggravated by use of  steroids. On insulin coverage and stable.  GERD: PPI.  Epistaxis: Stopped spontaneously.  DVT prophylaxis: enoxaparin (LOVENOX) injection 40 mg Start: 11/18/19 2200   Code Status: Full code  Family Communication: husband on the phone  Disposition Plan: Status is: Inpatient  Remains inpatient appropriate because:Inpatient level of care appropriate due to severity of illness   Dispo: The patient is from: Home              Anticipated d/c is to: Home              Anticipated d/c date is: 2 days              Patient currently is not medically stable to d/c.         Consultants:   None   Procedures:   none  Antimicrobials:  Anti-infectives (From admission, onward)   Start     Dose/Rate Route Frequency Ordered Stop   11/19/19 1000  remdesivir 100 mg in sodium chloride 0.9 % 100 mL IVPB  Status:  Discontinued       "Followed by" Linked Group Details   100 mg 200 mL/hr over 30 Minutes Intravenous Daily 11/18/19 1219 11/18/19 1232   11/19/19 1000  remdesivir 100 mg in sodium chloride 0.9 % 100 mL IVPB        100 mg 200 mL/hr over 30 Minutes Intravenous Daily 11/18/19 1232 11/22/19 1150   11/18/19 1300  remdesivir 100 mg in sodium chloride 0.9 % 100 mL IVPB        100 mg 200 mL/hr over 30 Minutes Intravenous Every 30 min 11/18/19 1232 11/18/19 1552   11/18/19 1230  remdesivir 200 mg in sodium chloride 0.9%  250 mL IVPB  Status:  Discontinued       "Followed by" Linked Group Details   200 mg 580 mL/hr over 30 Minutes Intravenous Once 11/18/19 1219 11/18/19 1232         Subjective: Patient seen and examined.  No overnight events.  Feels weak and deconditioned.  Able to maintain on 3 to 4 L of oxygen.  Objective: Vitals:   11/30/19 0538 11/30/19 0700 11/30/19 0725 11/30/19 1311  BP:    122/61  Pulse:    (!) 52  Resp:    17  Temp:    97.9 F (36.6 C)  TempSrc:    Oral  SpO2: 95% 95% 98% (!) 86%  Weight:        Intake/Output Summary (Last 24 hours) at  11/30/2019 1338 Last data filed at 11/30/2019 0900 Gross per 24 hour  Intake 360 ml  Output 2050 ml  Net -1690 ml   Filed Weights   11/18/19 1156  Weight: 74.8 kg    Examination:  General exam: Looks comfortable, anxious and debilitated. Respiratory system: Poor air entry bilateral.  No added sounds. Cardiovascular system: S1 & S2 heard, RRR. No JVD, murmurs, rubs, gallops or clicks. No pedal edema. Gastrointestinal system: Abdomen is nondistended, soft and nontender. No organomegaly or masses felt. Normal bowel sounds heard. Central nervous system: Alert and oriented. No focal neurological deficits. Extremities: Symmetric 5 x 5 power. Skin: No rashes, lesions or ulcers Psychiatry: Judgement and insight appear normal. Mood & affect appropriate.     Data Reviewed: I have personally reviewed following labs and imaging studies  CBC: Recent Labs  Lab 11/26/19 0543 11/27/19 0632 11/28/19 0505 11/29/19 0415 11/30/19 0407  WBC 12.7* 15.0* 14.6* 18.4* 14.8*  NEUTROABS 11.0* 13.2* 12.9* 15.8* 12.8*  HGB 11.7* 12.0 12.0 12.0 11.9*  HCT 35.0* 35.8* 36.3 35.6* 35.4*  MCV 91.4 92.3 92.8 92.5 92.2  PLT 270 257 241 219 379   Basic Metabolic Panel: Recent Labs  Lab 11/24/19 0443 11/24/19 0443 11/25/19 0435 11/25/19 0435 11/26/19 0543 11/27/19 0632 11/28/19 0505 11/29/19 0415 11/30/19 0407  NA 140  --  137  --  138 139 138  --   --   K 3.8  --  4.2  --  4.4 5.0 4.6  --   --   CL 103  --  102  --  101 104 103  --   --   CO2 25  --  26  --  27 27 26   --   --   GLUCOSE 200*  --  208*  --  193* 187* 207*  --   --   BUN 25*  --  27*  --  24* 21 20  --   --   CREATININE 0.56  --  0.58  --  0.49 0.50 0.55  --   --   CALCIUM 8.1*  --  7.8*  --  8.0* 8.3* 8.2*  --   --   MG 2.6*   < > 2.6*   < > 2.7* 2.5* 2.4 2.4 2.3   < > = values in this interval not displayed.   GFR: CrCl cannot be calculated (Unknown ideal weight.). Liver Function Tests: Recent Labs  Lab 11/24/19 0443  11/25/19 0435 11/26/19 0543 11/27/19 0632 11/28/19 0505  AST 26 23 21 22 18   ALT 28 24 27 28 31   ALKPHOS 51 43 41 40 40  BILITOT 0.7 0.6 0.5 0.5 0.4  PROT 5.6* 5.3*  5.4* 5.3* 5.4*  ALBUMIN 2.8* 2.4* 2.5* 2.5* 2.5*   No results for input(s): LIPASE, AMYLASE in the last 168 hours. No results for input(s): AMMONIA in the last 168 hours. Coagulation Profile: No results for input(s): INR, PROTIME in the last 168 hours. Cardiac Enzymes: No results for input(s): CKTOTAL, CKMB, CKMBINDEX, TROPONINI in the last 168 hours. BNP (last 3 results) No results for input(s): PROBNP in the last 8760 hours. HbA1C: No results for input(s): HGBA1C in the last 72 hours. CBG: Recent Labs  Lab 11/29/19 1105 11/29/19 1624 11/29/19 2101 11/30/19 0748 11/30/19 1117  GLUCAP 107* 280* 154* 124* 201*   Lipid Profile: No results for input(s): CHOL, HDL, LDLCALC, TRIG, CHOLHDL, LDLDIRECT in the last 72 hours. Thyroid Function Tests: No results for input(s): TSH, T4TOTAL, FREET4, T3FREE, THYROIDAB in the last 72 hours. Anemia Panel: No results for input(s): VITAMINB12, FOLATE, FERRITIN, TIBC, IRON, RETICCTPCT in the last 72 hours. Sepsis Labs: No results for input(s): PROCALCITON, LATICACIDVEN in the last 168 hours.  No results found for this or any previous visit (from the past 240 hour(s)).       Radiology Studies: DG Sinuses Complete  Result Date: 11/29/2019 CLINICAL DATA:  Sinus region pain.  COVID-19 positive EXAM: PARANASAL SINUSES - COMPLETE 3 + VIEW COMPARISON:  None. FINDINGS: Upright frontal, lateral, [place], and submental vertex images were obtained. There is an apparent air-fluid level in the right maxillary antrum. Hazy opacity is noted in the inferior right frontal sinus region. Other paranasal sinuses and mastoids are clear. No bony destruction or expansion. IMPRESSION: Air-fluid level seen in the right maxillary antrum consistent with sinusitis. Ill-defined hazy opacity in inferior  right frontal sinus also felt to be indicative of a degree of sinusitis. No bony abnormality. Electronically Signed   By: Lowella Grip III M.D.   On: 11/29/2019 09:18   DG Abd Portable 1V  Result Date: 11/29/2019 CLINICAL DATA:  Abdominal distension, constipation EXAM: PORTABLE ABDOMEN - 1 VIEW COMPARISON:  None. FINDINGS: The bowel gas pattern is normal. No radio-opaque calculi or other significant radiographic abnormality are seen. Mild to moderate volume colonic stool burden. IMPRESSION: 1. Nonobstructive bowel gas pattern. 2. Mild to moderate volume colonic stool burden. Electronically Signed   By: Davina Poke D.O.   On: 11/29/2019 11:21        Scheduled Meds: . vitamin C  500 mg Oral Daily  . baricitinib  4 mg Oral Daily  . calcium carbonate  1 tablet Oral Q breakfast  . cholecalciferol  1,000 Units Oral Daily  . enoxaparin (LOVENOX) injection  40 mg Subcutaneous Q24H  . feeding supplement (GLUCERNA SHAKE)  237 mL Oral TID BM  . fluticasone  2 spray Each Nare Daily  . insulin aspart  0-15 Units Subcutaneous TID WC  . Ipratropium-Albuterol  1 puff Inhalation TID  . loratadine  10 mg Oral QHS  . methylPREDNISolone (SOLU-MEDROL) injection  40 mg Intravenous BID  . nystatin  5 mL Oral QID  . oxymetazoline  1 spray Each Nare BID  . pantoprazole  40 mg Oral QAC breakfast  . senna-docusate  1 tablet Oral BID  . zinc sulfate  220 mg Oral Daily   Continuous Infusions:   LOS: 12 days    Time spent: 35 minutes    Barb Merino, MD [facility] Pager [phone]

## 2019-11-30 NOTE — TOC Progression Note (Signed)
Transition of Care [facility]) - Progression Note    Patient Details  Name: [name] [name] MRN: [id number] Date of Birth: 1951/02/19  Transition of Care [facility]) CM/SW [place], [place] Phone Number: 11/30/2019, 4:09 PM  Clinical Narrative:   O2 ordered for patient who will be leaving tomorrow. Notes and orders seen and appreciated.  Called Zach with [employer] to coordinate.  Called insurance to ask for PTAR authorization as patient's husband is wheelchair bound and unable to pick up, and patient would have to walk through gravel pulling O2 to get to front door of home.  [employer] transport driver is unable to assist to door. TOC will continue to follow during the course of hospitalization.     Expected Discharge Plan: [place] Barriers to Discharge: Barriers Resolved  Expected Discharge Plan and Services Expected Discharge Plan: [place]   Discharge Planning Services: CM Consult Post Acute Care Choice: [place] arrangements for the past 2 months: [place]: PT [place]: [facility] Date [place]: 11/28/19 Time [place]: 1221 Representative spoke with at [place]: [place]   Social Determinants of Health ([place]) Interventions    Readmission Risk Interventions No flowsheet data found.

## 2019-11-30 NOTE — Progress Notes (Signed)
Physical Therapy Treatment Patient Details Name: [name] [name] MRN: [id number] DOB: 1950-03-13 Today's Date: 11/30/2019    History of Present Illness Pt admitted with SOB 2* COVID PNA and with hx of DM and anxiety/depression    PT Comments    Pt progressing, increasing tolerance to activity, decr DOE. SpO2=94%--79%--93% on 3L [place] O2,  1-2 minute recovery time and max RR 20.  Pt reports feeling better overall. Will continue PT POC.    Follow Up Recommendations  Home health PT     Equipment Recommendations  None recommended by PT    Recommendations for Other Services       Precautions / Restrictions Precautions Precautions: Fall Precaution Comments: Monitor O2 Restrictions Weight Bearing Restrictions: No    Mobility  Bed Mobility               General bed mobility comments: in recliner  Transfers Overall transfer level: Needs assistance Equipment used: Rolling walker (2 wheeled) Transfers: Sit to/from Stand Sit to Stand: Min guard;Supervision         General transfer comment: cues for hand placement   Ambulation/Gait Ambulation/Gait assistance: Min guard Gait Distance (Feet): 60 Feet Assistive device: Rolling walker (2 wheeled) Gait Pattern/deviations: Step-through pattern;Decreased stride length;Trunk flexed Gait velocity: decreased   General Gait Details: cues for trunk extension, proper breathing, min/guard for safety    Stairs             Wheelchair Mobility    Modified Rankin (Stroke Patients Only)       Balance                                            Cognition Arousal/Alertness: Awake/alert Behavior During Therapy: WFL for tasks assessed/performed;Flat affect Overall Cognitive Status: Within Functional Limits for tasks assessed                                        Exercises      General Comments        Pertinent Vitals/Pain Pain Assessment: No/denies pain    Home Living                       Prior Function            PT Goals (current goals can now be found in the care plan section) Acute Rehab PT Goals Patient Stated Goal: Breathe better and go home PT Goal Formulation: With patient Time For Goal Achievement: 12/08/19 Potential to Achieve Goals: Good Progress towards PT goals: Progressing toward goals    Frequency    Min 3X/week      PT Plan Current plan remains appropriate    Co-evaluation              AM-PAC PT "6 Clicks" Mobility   Outcome Measure  Help needed turning from your back to your side while in a flat bed without using bedrails?: A Little Help needed moving from lying on your back to sitting on the side of a flat bed without using bedrails?: A Little Help needed moving to and from a bed to a chair (including a wheelchair)?: A Little Help needed standing up from a chair using your arms (e.g., wheelchair or bedside chair)?: A Little Help needed to walk  in hospital room?: A Little Help needed climbing 3-5 steps with a railing? : A Lot 6 Click Score: 17    End of Session Equipment Utilized During Treatment: Gait belt;Oxygen Activity Tolerance: Patient tolerated treatment well Patient left: in chair;with call bell/phone within reach (alarm not set on PT arrival )   PT Visit Diagnosis: Unsteadiness on feet (R26.81);Muscle weakness (generalized) (M62.81);Difficulty in walking, not elsewhere classified (R26.2)     Time: 7262-0355 PT Time Calculation (min) (ACUTE ONLY): 14 min  Charges:  $Gait Training: 8-22 mins                     Baxter Flattery, PT  Acute Rehab Dept ([place]) [phone] Pager [phone]  11/30/2019    [facility] 11/30/2019, 11:40 AM

## 2019-11-30 NOTE — Progress Notes (Signed)
SATURATION QUALIFICATIONS: (This note is used to comply with regulatory documentation for home oxygen)  Patient Saturations on Room Air at Rest = 90%  Patient Saturations on Room Air while Ambulating = 88%  Patient Saturations on 3 Liters of oxygen while Ambulating = 94%  Please briefly explain why patient needs home oxygen: Patients falls below the therapeutic level.

## 2019-12-01 LAB — CBC WITH DIFFERENTIAL/PLATELET
Abs Immature Granulocytes: 0.27 10*3/uL — ABNORMAL HIGH (ref 0.00–0.07)
Basophils Absolute: 0 10*3/uL (ref 0.0–0.1)
Basophils Relative: 0 %
Eosinophils Absolute: 0 10*3/uL (ref 0.0–0.5)
Eosinophils Relative: 0 %
HCT: 35.5 % — ABNORMAL LOW (ref 36.0–46.0)
Hemoglobin: 11.9 g/dL — ABNORMAL LOW (ref 12.0–15.0)
Immature Granulocytes: 2 %
Lymphocytes Relative: 8 %
Lymphs Abs: 1.3 10*3/uL (ref 0.7–4.0)
MCH: 31.2 pg (ref 26.0–34.0)
MCHC: 33.5 g/dL (ref 30.0–36.0)
MCV: 92.9 fL (ref 80.0–100.0)
Monocytes Absolute: 1 10*3/uL (ref 0.1–1.0)
Monocytes Relative: 6 %
Neutro Abs: 13.8 10*3/uL — ABNORMAL HIGH (ref 1.7–7.7)
Neutrophils Relative %: 84 %
Platelets: 240 10*3/uL (ref 150–400)
RBC: 3.82 MIL/uL — ABNORMAL LOW (ref 3.87–5.11)
RDW: 13 % (ref 11.5–15.5)
WBC: 16.4 10*3/uL — ABNORMAL HIGH (ref 4.0–10.5)
nRBC: 0 % (ref 0.0–0.2)

## 2019-12-01 LAB — GLUCOSE, CAPILLARY
Glucose-Capillary: 123 mg/dL — ABNORMAL HIGH (ref 70–99)
Glucose-Capillary: 135 mg/dL — ABNORMAL HIGH (ref 70–99)

## 2019-12-01 LAB — D-DIMER, QUANTITATIVE: D-Dimer, Quant: 2.43 ug/mL-FEU — ABNORMAL HIGH (ref 0.00–0.50)

## 2019-12-01 LAB — MAGNESIUM: Magnesium: 2.4 mg/dL (ref 1.7–2.4)

## 2019-12-01 LAB — C-REACTIVE PROTEIN: CRP: 0.5 mg/dL (ref <1.0)

## 2019-12-01 NOTE — Plan of Care (Signed)

## 2019-12-01 NOTE — Progress Notes (Signed)
PTAR arrived and picked up patient to be transported home.

## 2019-12-01 NOTE — Discharge Summary (Signed)
Physician Discharge Summary  [name] [name] WGY:[id number] DOB: 1950/05/20 DOA: 11/18/2019  PCP: Ardith Dark, PA-C  Admit date: 11/18/2019 Discharge date: 12/01/2019  Admitted From: Home Disposition: Home with home health  Recommendations for Outpatient Follow-up:  1. Follow up with PCP in 1-2 weeks   Home Health: Physical therapy Equipment/Devices: Oxygen by nasal cannula  Discharge Condition: Stable CODE STATUS: Full code Diet recommendation: Low-salt diet  Discharge summary: 69 year old female with history of asthma, type 2 diabetes on Metformin, GERD, known Covid exposure and tested positive on 9/13, not vaccinated against COVID-19 was suffering from body ache, cough and sore throat as well congestion for 10 days and presented to the ER on 9/17.  In the emergency room she was hypoxic needing 5 to 6 L of oxygen, chest x-ray with bilateral pneumonia.  Patient was using ivermectin and other over-the-counter medications before coming to the hospital.  Patient was admitted to the hospital and treated for acute hypoxemic respiratory failure due to COVID-19 virus pneumonia.  She had significant symptoms.  She stayed in the hospital for 12 days and treated with aggressive supportive treatment as well as COVID-19 directed therapy with high-dose steroids, baricitinib for day 10, remdesivir for 5 days.  She had good clinical recovery.  Had elevated D-dimer but negative CT of the chest and lower extremity duplexes.  Patient did good clinical recovery and currently on minimum oxygen.  Plan: -Had prolonged high-dose steroid use, will gradually taper off with prednisone. -Discharging home with supplemental oxygen, mainly needing on mobility.  She will continue to do breathing treatments, inhaler therapies and cough medications at home. -Total isolation duration for 3 weeks until 10/3 -Highly recommended to take COVID-19 vaccination and patient is agreeable once her current symptoms  improved. -Patient is very deconditioned, she will benefit with physical therapy at home, home health PT prescribed. -She was given supplemental insulin while using high-dose steroids, will go back on Metformin with close monitoring of sugar at home.  She will taper off her steroids.  Continue all other long-term medications.  Discharge Diagnoses:  Principal Problem:   Acute hypoxemic respiratory failure due to COVID-19 [facility]) Active Problems:   Allergic rhinitis   GERD   Pneumonia due to COVID-19 virus   Type 2 diabetes mellitus (HCC)   Acute respiratory failure with hypoxia (HCC)   Diabetes mellitus type 2, controlled, without complications [facility])    Discharge Instructions  Discharge Instructions    Call MD for:  difficulty breathing, headache or visual disturbances   Complete by: As directed    Call MD for:  persistant dizziness or light-headedness   Complete by: As directed    Diet - low sodium heart healthy   Complete by: As directed    Discharge instructions   Complete by: As directed    Can use over-the-counter cough medications and Tylenol as needed. Continue doing deep breathing exercises and breathing techniques as taught in the hospital. Highly encourage you take vaccination against COVID-19 once you improved from current symptoms. Total isolation duration 21 days from your initial diagnosis 9/13-10/3   Increase activity slowly   Complete by: As directed    MyChart COVID-19 home monitoring program   Complete by: Dec 01, 2019    Is the patient willing to use the [place] for home monitoring?: Yes   Temperature monitoring   Complete by: Dec 01, 2019    After how many days would you like to receive a notification of this patient's flowsheet entries?: 1  Allergies as of 12/01/2019      Reactions   Ciprofloxacin Other (See Comments)   Pt. Reports a tendon issue s/p cipro   Latex Itching   Other Other (See Comments)   Bloating stomach   Sulfonamide  Derivatives Other (See Comments)   Bloating stomach      Medication List    STOP taking these medications   benzonatate 200 MG capsule Commonly known as: TESSALON   ivermectin 3 MG Tabs tablet Commonly known as: STROMECTOL   naproxen sodium 220 MG tablet Commonly known as: ALEVE     TAKE these medications   albuterol 108 (90 Base) MCG/ACT inhaler Commonly known as: ProAir HFA Inhale 2 puffs into the lungs every 4 (four) hours as needed for wheezing or shortness of breath.   ascorbic acid 500 MG tablet Commonly known as: VITAMIN C Take 1 tablet (500 mg total) by mouth daily.   calcium carbonate 600 MG Tabs tablet Commonly known as: OS-CAL Take 600 mg by mouth daily.   cholecalciferol 25 MCG (1000 UNIT) tablet Commonly known as: VITAMIN D Take 1,000 Units by mouth daily.   EMERGEN-C IMMUNE PO Take 1,000-2,000 mg by mouth See admin instructions. Take 2000mg  in the morning, 2000mg  in the afternoon and 1000mg  at night   fish oil-omega-3 fatty acids 1000 MG capsule Take 1 g by mouth daily.   fluticasone 50 MCG/ACT nasal spray Commonly known as: FLONASE Place 1 spray into both nostrils daily as needed for allergies.   metFORMIN 500 MG 24 hr tablet Commonly known as: GLUCOPHAGE-XR Take 500 mg by mouth in the morning and at bedtime.   multivitamin tablet Take 1 tablet by mouth daily.   nystatin 100000 UNIT/ML suspension Commonly known as: MYCOSTATIN Take 5 mLs (500,000 Units total) by mouth 4 (four) times daily.   omeprazole 20 MG tablet Commonly known as: PRILOSEC OTC Take 20 mg by mouth daily as needed (for heartburn).   OVER THE COUNTER MEDICATION Take 1 capsule by mouth daily. "Quercertin"   polyethylene glycol 17 g packet Commonly known as: MIRALAX / GLYCOLAX Take 17 g by mouth daily as needed for mild constipation.   predniSONE 10 MG tablet Commonly known as: DELTASONE Take 50mg  daily for 3days,Take 40mg  daily for 3days,Take 30mg  daily for 3days,Take  20mg  daily for 3days,Take 10mg  daily for 3days, then stop   SALINE NA Place 1 packet into the nose 2 (two) times daily as needed (dry nose).   ZINC PO Take 1 capsule by mouth daily.            Durable Medical Equipment  (From admission, onward)         Start     Ordered   11/30/19 1331  For home use only DME oxygen  Once       Comments: Patient Saturations on Room Air at Rest = 90%  Patient Saturations on Room Air while Ambulating = 88%  Patient Saturations on 3 Liters of oxygen while Ambulating = 94%  Question Answer Comment  Length of Need 6 Months   Mode or (Route) Nasal cannula   Liters per Minute 6   Frequency Continuous (stationary and portable oxygen unit needed)   Oxygen delivery system Gas      11/30/19 1330          Follow-up Information    [facility] Follow up.   Specialty: Home Health Services Why: Agency will provide home health physical therapy. Contact information: [address] [place] [address] [phone]        Ardith Dark, PA-C. Schedule an appointment as soon as possible for a visit in 1 week(s).   Specialty: Physician Assistant Contact information: [address] [place] [address] [phone]              Allergies  Allergen Reactions  . Ciprofloxacin Other (See Comments)    Pt. Reports a tendon issue s/p cipro  . Latex Itching  . Other Other (See Comments)    Bloating stomach  . Sulfonamide Derivatives Other (See Comments)    Bloating stomach    Consultations:  None   Procedures/Studies: DG Sinuses Complete  Result Date: 11/29/2019 CLINICAL DATA:  Sinus region pain.  COVID-19 positive EXAM: PARANASAL SINUSES - COMPLETE 3 + VIEW COMPARISON:  None. FINDINGS: Upright frontal, lateral, [place], and submental vertex images were obtained. There is an apparent air-fluid level in the right maxillary antrum. Hazy opacity is noted in the inferior right frontal sinus region.  Other paranasal sinuses and mastoids are clear. No bony destruction or expansion. IMPRESSION: Air-fluid level seen in the right maxillary antrum consistent with sinusitis. Ill-defined hazy opacity in inferior right frontal sinus also felt to be indicative of a degree of sinusitis. No bony abnormality. Electronically Signed   By: Lowella Grip III M.D.   On: 11/29/2019 09:18   CT ANGIO CHEST PE W OR WO CONTRAST  Result Date: 11/22/2019 CLINICAL DATA:  Hypoxia. EXAM: CT ANGIOGRAPHY CHEST WITH CONTRAST TECHNIQUE: Multidetector CT imaging of the chest was performed using the standard protocol during bolus administration of intravenous contrast. Multiplanar CT image reconstructions and MIPs were obtained to evaluate the vascular anatomy. CONTRAST:  142mL OMNIPAQUE IOHEXOL 350 MG/ML SOLN COMPARISON:  None. FINDINGS: Cardiovascular: Satisfactory opacification of the pulmonary arteries to the segmental level. No evidence of pulmonary embolism. Normal heart size. No pericardial effusion. Mediastinum/Nodes: No enlarged mediastinal, hilar, or axillary lymph nodes. Thyroid gland, trachea, and esophagus demonstrate no significant findings. Lungs/Pleura: No pneumothorax or pleural effusion is noted. Patchy airspace opacities are noted throughout both lungs concerning for multifocal pneumonia. Upper Abdomen: No acute abnormality. Musculoskeletal: No chest wall abnormality. No acute or significant osseous findings. Review of the MIP images confirms the above findings. IMPRESSION: 1. No definite evidence of pulmonary embolus. 2. Patchy airspace opacities are noted throughout both lungs concerning for multifocal pneumonia. Electronically Signed   By: Marijo Conception M.D.   On: 11/22/2019 18:45   DG CHEST PORT 1 VIEW  Result Date: 11/24/2019 CLINICAL DATA:  Shortness of breath.  COVID-19 positive EXAM: PORTABLE CHEST 1 VIEW COMPARISON:  November 18, 2019 and CT angiogram chest November 22, 2019 FINDINGS: Ill-defined  areas of airspace opacity persist without progression compared to most recent CT examination. Marginally less opacity noted compared to most recent chest radiograph. No consolidation evident. Heart size and pulmonary vascularity are normal. No adenopathy. No bone lesions. IMPRESSION: Ill-defined areas of airspace opacity likely representing multifocal atypical organism pneumonia. Slightly less opacity overall compared to most recent chest radiograph with essentially stable appearance compared to CT from 2 days prior. No new areas of airspace opacity. No consolidation. Cardiac silhouette normal.  No adenopathy Electronically Signed   By: Lowella Grip III M.D.   On: 11/24/2019 14:22   DG Chest Port 1 View  Result Date: 11/18/2019 CLINICAL DATA:  COVID, dyspnea EXAM: PORTABLE CHEST 1 VIEW COMPARISON:  11/19/2010 chest radiograph. FINDINGS: Stable cardiomediastinal silhouette with normal heart size. No  pneumothorax. No pleural effusion. Hazy patchy mid and lower right lung opacities, new. IMPRESSION: New hazy patchy mid and lower right lung opacities, suspicious for pneumonia. Electronically Signed   By: Ilona Sorrel M.D.   On: 11/18/2019 12:40   DG Abd Portable 1V  Result Date: 11/29/2019 CLINICAL DATA:  Abdominal distension, constipation EXAM: PORTABLE ABDOMEN - 1 VIEW COMPARISON:  None. FINDINGS: The bowel gas pattern is normal. No radio-opaque calculi or other significant radiographic abnormality are seen. Mild to moderate volume colonic stool burden. IMPRESSION: 1. Nonobstructive bowel gas pattern. 2. Mild to moderate volume colonic stool burden. Electronically Signed   By: Davina Poke D.O.   On: 11/29/2019 11:21   VAS [place] LOWER EXTREMITY VENOUS (DVT)  Result Date: 11/23/2019  Lower Venous DVTStudy Indications: Elevated Ddimer.  Risk Factors: COVID 19 positive. Comparison Study: No prior studies. Performing Technologist: Oliver Hum RVT  Examination Guidelines: A complete evaluation  includes B-mode imaging, spectral Doppler, color Doppler, and power Doppler as needed of all accessible portions of each vessel. Bilateral testing is considered an integral part of a complete examination. Limited examinations for reoccurring indications may be performed as noted. The reflux portion of the exam is performed with the patient in reverse Trendelenburg.  +---------+---------------+---------+-----------+----------+--------------+ RIGHT    CompressibilityPhasicitySpontaneityPropertiesThrombus Aging +---------+---------------+---------+-----------+----------+--------------+ CFV      Full           Yes      Yes                                 +---------+---------------+---------+-----------+----------+--------------+ SFJ      Full                                                        +---------+---------------+---------+-----------+----------+--------------+ FV Prox  Full                                                        +---------+---------------+---------+-----------+----------+--------------+ FV Mid   Full                                                        +---------+---------------+---------+-----------+----------+--------------+ FV DistalFull                                                        +---------+---------------+---------+-----------+----------+--------------+ PFV      Full                                                        +---------+---------------+---------+-----------+----------+--------------+ POP      Full  Yes      Yes                                 +---------+---------------+---------+-----------+----------+--------------+ PTV      Full                                                        +---------+---------------+---------+-----------+----------+--------------+ PERO     Full                                                         +---------+---------------+---------+-----------+----------+--------------+   +---------+---------------+---------+-----------+----------+--------------+ LEFT     CompressibilityPhasicitySpontaneityPropertiesThrombus Aging +---------+---------------+---------+-----------+----------+--------------+ CFV      Full           Yes      Yes                                 +---------+---------------+---------+-----------+----------+--------------+ SFJ      Full                                                        +---------+---------------+---------+-----------+----------+--------------+ FV Prox  Full                                                        +---------+---------------+---------+-----------+----------+--------------+ FV Mid   Full                                                        +---------+---------------+---------+-----------+----------+--------------+ FV DistalFull                                                        +---------+---------------+---------+-----------+----------+--------------+ PFV      Full                                                        +---------+---------------+---------+-----------+----------+--------------+ POP      Full           Yes      Yes                                 +---------+---------------+---------+-----------+----------+--------------+ PTV  Full                                                        +---------+---------------+---------+-----------+----------+--------------+ PERO     Full                                                        +---------+---------------+---------+-----------+----------+--------------+     Summary: RIGHT: - There is no evidence of deep vein thrombosis in the lower extremity.  - No cystic structure found in the popliteal fossa.  LEFT: - There is no evidence of deep vein thrombosis in the lower extremity.  - No cystic structure found in the popliteal fossa.   *See table(s) above for measurements and observations. Electronically signed by Harold Barban MD on 11/23/2019 at 9:33:55 PM.    Final    (Echo, Carotid, EGD, Colonoscopy, ERCP)    Subjective: Patient seen and examined.  No overnight events.  She is on minimum oxygen.  Feels weak but very excited to go home. All questions were answered to the extent of patient satisfaction and she is very highly motivated for mobility, lung exercises at home and take COVID-19 vaccination once she is better from current issues.   Discharge Exam: Vitals:   11/30/19 2059 12/01/19 0506  BP: 105/65 (!) 111/55  Pulse: 77 64  Resp: 17 16  Temp: 98.1 F (36.7 C) 98.1 F (36.7 C)  SpO2: 93% 94%   Vitals:   11/30/19 1630 11/30/19 2059 11/30/19 2249 12/01/19 0506  BP:  105/65  (!) 111/55  Pulse:  77  64  Resp:  17  16  Temp:  98.1 F (36.7 C)  98.1 F (36.7 C)  TempSrc:  Oral  Oral  SpO2: 97% 93%  94%  Weight:      Height:   6' (1.829 m)     General: Pt is alert, awake, not in acute distress On 1 L oxygen at rest. Cardiovascular: RRR, S1/S2 +, no rubs, no gallops Respiratory: CTA bilaterally, no wheezing, no rhonchi, no added sounds. Abdominal: Soft, NT, ND, bowel sounds + Extremities: no edema, no cyanosis    The results of significant diagnostics from this hospitalization (including imaging, microbiology, ancillary and laboratory) are listed below for reference.     Microbiology: No results found for this or any previous visit (from the past 240 hour(s)).   Labs: BNP (last 3 results) No results for input(s): BNP in the last 8760 hours. Basic Metabolic Panel: Recent Labs  Lab 11/25/19 0435 11/25/19 0435 11/26/19 0543 11/26/19 0543 11/27/19 3888 11/28/19 0505 11/29/19 0415 11/30/19 0407 12/01/19 0417  NA 137  --  138  --  139 138  --   --   --   K 4.2  --  4.4  --  5.0 4.6  --   --   --   CL 102  --  101  --  104 103  --   --   --   CO2 26  --  27  --  27 26  --   --   --    GLUCOSE 208*  --  193*  --  187*  207*  --   --   --   BUN 27*  --  24*  --  21 20  --   --   --   CREATININE 0.58  --  0.49  --  0.50 0.55  --   --   --   CALCIUM 7.8*  --  8.0*  --  8.3* 8.2*  --   --   --   MG 2.6*   < > 2.7*   < > 2.5* 2.4 2.4 2.3 2.4   < > = values in this interval not displayed.   Liver Function Tests: Recent Labs  Lab 11/25/19 0435 11/26/19 0543 11/27/19 0632 11/28/19 0505  AST 23 21 22 18   ALT 24 27 28 31   ALKPHOS 43 41 40 40  BILITOT 0.6 0.5 0.5 0.4  PROT 5.3* 5.4* 5.3* 5.4*  ALBUMIN 2.4* 2.5* 2.5* 2.5*   No results for input(s): LIPASE, AMYLASE in the last 168 hours. No results for input(s): AMMONIA in the last 168 hours. CBC: Recent Labs  Lab 11/27/19 0632 11/28/19 0505 11/29/19 0415 11/30/19 0407 12/01/19 0417  WBC 15.0* 14.6* 18.4* 14.8* 16.4*  NEUTROABS 13.2* 12.9* 15.8* 12.8* 13.8*  HGB 12.0 12.0 12.0 11.9* 11.9*  HCT 35.8* 36.3 35.6* 35.4* 35.5*  MCV 92.3 92.8 92.5 92.2 92.9  PLT 257 241 219 265 240   Cardiac Enzymes: No results for input(s): CKTOTAL, CKMB, CKMBINDEX, TROPONINI in the last 168 hours. BNP: Invalid input(s): POCBNP CBG: Recent Labs  Lab 11/30/19 0748 11/30/19 1117 11/30/19 1559 11/30/19 2205 12/01/19 0739  GLUCAP 124* 201* 180* 125* 123*   D-Dimer Recent Labs    11/30/19 0407 12/01/19 0417  DDIMER 3.64* 2.43*   Hgb A1c No results for input(s): HGBA1C in the last 72 hours. Lipid Profile No results for input(s): CHOL, HDL, LDLCALC, TRIG, CHOLHDL, LDLDIRECT in the last 72 hours. Thyroid function studies No results for input(s): TSH, T4TOTAL, T3FREE, THYROIDAB in the last 72 hours.  Invalid input(s): FREET3 Anemia work up No results for input(s): VITAMINB12, FOLATE, FERRITIN, TIBC, IRON, RETICCTPCT in the last 72 hours. Urinalysis    Component Value Date/Time   COLORURINE LT YELLOW 09/27/2007 0822   APPEARANCEUR Clear 09/27/2007 0822   LABSPEC 1.020 09/27/2007 0822   PHURINE 6.0 09/27/2007 0822    GLUCOSEU NEGATIVE 09/27/2007 0822   BILIRUBINUR NEGATIVE 09/27/2007 0822   KETONESUR NEGATIVE 09/27/2007 0822   UROBILINOGEN 0.2 mg/dL 09/27/2007 0822   NITRITE Negative 09/27/2007 0822   LEUKOCYTESUR Negative 09/27/2007 0822   Sepsis Labs Invalid input(s): PROCALCITONIN,  WBC,  LACTICIDVEN Microbiology No results found for this or any previous visit (from the past 240 hour(s)).   Time coordinating discharge:  45 minutes  SIGNED:   Barb Merino, MD  [facility] 12/01/2019, 11:08 AM

## 2019-12-03 DIAGNOSIS — J1282 Pneumonia due to coronavirus disease 2019: Secondary | ICD-10-CM | POA: Diagnosis not present

## 2019-12-03 DIAGNOSIS — Z7951 Long term (current) use of inhaled steroids: Secondary | ICD-10-CM | POA: Diagnosis not present

## 2019-12-03 DIAGNOSIS — F419 Anxiety disorder, unspecified: Secondary | ICD-10-CM | POA: Diagnosis not present

## 2019-12-03 DIAGNOSIS — K5790 Diverticulosis of intestine, part unspecified, without perforation or abscess without bleeding: Secondary | ICD-10-CM | POA: Diagnosis not present

## 2019-12-03 DIAGNOSIS — M47812 Spondylosis without myelopathy or radiculopathy, cervical region: Secondary | ICD-10-CM | POA: Diagnosis not present

## 2019-12-03 DIAGNOSIS — Z86010 Personal history of colonic polyps: Secondary | ICD-10-CM | POA: Diagnosis not present

## 2019-12-03 DIAGNOSIS — M1990 Unspecified osteoarthritis, unspecified site: Secondary | ICD-10-CM | POA: Diagnosis not present

## 2019-12-03 DIAGNOSIS — U071 COVID-19: Secondary | ICD-10-CM | POA: Diagnosis not present

## 2019-12-03 DIAGNOSIS — J309 Allergic rhinitis, unspecified: Secondary | ICD-10-CM | POA: Diagnosis not present

## 2019-12-03 DIAGNOSIS — Z794 Long term (current) use of insulin: Secondary | ICD-10-CM | POA: Diagnosis not present

## 2019-12-03 DIAGNOSIS — J4520 Mild intermittent asthma, uncomplicated: Secondary | ICD-10-CM | POA: Diagnosis not present

## 2019-12-03 DIAGNOSIS — Z9181 History of falling: Secondary | ICD-10-CM | POA: Diagnosis not present

## 2019-12-03 DIAGNOSIS — Z9981 Dependence on supplemental oxygen: Secondary | ICD-10-CM | POA: Diagnosis not present

## 2019-12-03 DIAGNOSIS — E119 Type 2 diabetes mellitus without complications: Secondary | ICD-10-CM | POA: Diagnosis not present

## 2019-12-03 DIAGNOSIS — F32A Depression, unspecified: Secondary | ICD-10-CM | POA: Diagnosis not present

## 2019-12-03 DIAGNOSIS — M5116 Intervertebral disc disorders with radiculopathy, lumbar region: Secondary | ICD-10-CM | POA: Diagnosis not present

## 2019-12-03 DIAGNOSIS — K219 Gastro-esophageal reflux disease without esophagitis: Secondary | ICD-10-CM | POA: Diagnosis not present

## 2019-12-12 ENCOUNTER — Other Ambulatory Visit: Payer: Self-pay | Admitting: Internal Medicine

## 2019-12-12 DIAGNOSIS — J9601 Acute respiratory failure with hypoxia: Secondary | ICD-10-CM

## 2020-01-17 DIAGNOSIS — R0602 Shortness of breath: Secondary | ICD-10-CM | POA: Diagnosis not present

## 2020-01-17 DIAGNOSIS — E119 Type 2 diabetes mellitus without complications: Secondary | ICD-10-CM | POA: Diagnosis not present

## 2020-01-17 DIAGNOSIS — R7303 Prediabetes: Secondary | ICD-10-CM | POA: Diagnosis not present

## 2020-01-17 DIAGNOSIS — J1282 Pneumonia due to coronavirus disease 2019: Secondary | ICD-10-CM | POA: Diagnosis not present

## 2020-01-17 DIAGNOSIS — M5136 Other intervertebral disc degeneration, lumbar region: Secondary | ICD-10-CM | POA: Diagnosis not present

## 2020-01-17 DIAGNOSIS — Z8616 Personal history of COVID-19: Secondary | ICD-10-CM | POA: Diagnosis not present

## 2020-01-17 DIAGNOSIS — R918 Other nonspecific abnormal finding of lung field: Secondary | ICD-10-CM | POA: Diagnosis not present

## 2020-01-17 DIAGNOSIS — R059 Cough, unspecified: Secondary | ICD-10-CM | POA: Diagnosis not present

## 2020-01-17 DIAGNOSIS — U071 COVID-19: Secondary | ICD-10-CM | POA: Diagnosis not present

## 2020-01-17 DIAGNOSIS — Z789 Other specified health status: Secondary | ICD-10-CM | POA: Diagnosis not present

## 2020-01-17 DIAGNOSIS — Z7984 Long term (current) use of oral hypoglycemic drugs: Secondary | ICD-10-CM | POA: Diagnosis not present

## 2020-01-17 DIAGNOSIS — M8580 Other specified disorders of bone density and structure, unspecified site: Secondary | ICD-10-CM | POA: Diagnosis not present

## 2020-01-17 DIAGNOSIS — Z23 Encounter for immunization: Secondary | ICD-10-CM | POA: Diagnosis not present

## 2020-01-17 DIAGNOSIS — Z1231 Encounter for screening mammogram for malignant neoplasm of breast: Secondary | ICD-10-CM | POA: Diagnosis not present

## 2020-01-19 DIAGNOSIS — M8588 Other specified disorders of bone density and structure, other site: Secondary | ICD-10-CM | POA: Diagnosis not present

## 2020-01-19 DIAGNOSIS — Z780 Asymptomatic menopausal state: Secondary | ICD-10-CM | POA: Diagnosis not present

## 2020-01-19 DIAGNOSIS — M8580 Other specified disorders of bone density and structure, unspecified site: Secondary | ICD-10-CM | POA: Diagnosis not present

## 2020-01-20 DIAGNOSIS — L719 Rosacea, unspecified: Secondary | ICD-10-CM | POA: Diagnosis not present

## 2020-01-20 DIAGNOSIS — L821 Other seborrheic keratosis: Secondary | ICD-10-CM | POA: Diagnosis not present

## 2020-01-20 DIAGNOSIS — L570 Actinic keratosis: Secondary | ICD-10-CM | POA: Diagnosis not present

## 2020-01-20 DIAGNOSIS — D225 Melanocytic nevi of trunk: Secondary | ICD-10-CM | POA: Diagnosis not present

## 2020-01-20 DIAGNOSIS — D1801 Hemangioma of skin and subcutaneous tissue: Secondary | ICD-10-CM | POA: Diagnosis not present

## 2020-03-05 DIAGNOSIS — Z1231 Encounter for screening mammogram for malignant neoplasm of breast: Secondary | ICD-10-CM | POA: Diagnosis not present

## 2020-03-21 DIAGNOSIS — R928 Other abnormal and inconclusive findings on diagnostic imaging of breast: Secondary | ICD-10-CM | POA: Diagnosis not present

## 2020-03-21 DIAGNOSIS — R920 Mammographic microcalcification found on diagnostic imaging of breast: Secondary | ICD-10-CM | POA: Diagnosis not present

## 2020-03-21 DIAGNOSIS — R921 Mammographic calcification found on diagnostic imaging of breast: Secondary | ICD-10-CM | POA: Diagnosis not present

## 2020-04-02 DIAGNOSIS — Z1211 Encounter for screening for malignant neoplasm of colon: Secondary | ICD-10-CM | POA: Diagnosis not present

## 2020-04-02 DIAGNOSIS — Z01419 Encounter for gynecological examination (general) (routine) without abnormal findings: Secondary | ICD-10-CM | POA: Diagnosis not present

## 2020-04-05 DIAGNOSIS — D242 Benign neoplasm of left breast: Secondary | ICD-10-CM | POA: Diagnosis not present

## 2020-04-05 DIAGNOSIS — D0502 Lobular carcinoma in situ of left breast: Secondary | ICD-10-CM | POA: Diagnosis not present

## 2020-04-05 DIAGNOSIS — R921 Mammographic calcification found on diagnostic imaging of breast: Secondary | ICD-10-CM | POA: Diagnosis not present

## 2020-04-15 DIAGNOSIS — Z01419 Encounter for gynecological examination (general) (routine) without abnormal findings: Secondary | ICD-10-CM | POA: Diagnosis not present

## 2020-04-27 DIAGNOSIS — D0502 Lobular carcinoma in situ of left breast: Secondary | ICD-10-CM | POA: Diagnosis not present

## 2020-05-08 DIAGNOSIS — Z01812 Encounter for preprocedural laboratory examination: Secondary | ICD-10-CM | POA: Diagnosis not present

## 2020-05-08 DIAGNOSIS — D0502 Lobular carcinoma in situ of left breast: Secondary | ICD-10-CM | POA: Diagnosis not present

## 2020-05-08 DIAGNOSIS — Z20822 Contact with and (suspected) exposure to covid-19: Secondary | ICD-10-CM | POA: Diagnosis not present

## 2020-05-10 DIAGNOSIS — D0502 Lobular carcinoma in situ of left breast: Secondary | ICD-10-CM | POA: Diagnosis not present

## 2020-05-15 DIAGNOSIS — D0502 Lobular carcinoma in situ of left breast: Secondary | ICD-10-CM | POA: Diagnosis not present

## 2020-05-28 DIAGNOSIS — Z23 Encounter for immunization: Secondary | ICD-10-CM | POA: Diagnosis not present

## 2020-08-08 DIAGNOSIS — E119 Type 2 diabetes mellitus without complications: Secondary | ICD-10-CM | POA: Diagnosis not present

## 2020-08-09 DIAGNOSIS — Z23 Encounter for immunization: Secondary | ICD-10-CM | POA: Diagnosis not present

## 2020-08-09 DIAGNOSIS — M7711 Lateral epicondylitis, right elbow: Secondary | ICD-10-CM | POA: Diagnosis not present

## 2020-08-09 DIAGNOSIS — E119 Type 2 diabetes mellitus without complications: Secondary | ICD-10-CM | POA: Diagnosis not present

## 2020-08-09 DIAGNOSIS — E785 Hyperlipidemia, unspecified: Secondary | ICD-10-CM | POA: Diagnosis not present

## 2020-08-09 DIAGNOSIS — Z0000 Encounter for general adult medical examination without abnormal findings: Secondary | ICD-10-CM | POA: Diagnosis not present

## 2020-11-12 IMAGING — CT CT ANGIO CHEST
3 of 7 series · 19 of 46 positions shown · IV contrast (APPLIED)
Comparison: None.

CLINICAL DATA: Hypoxia.

EXAM:
CT ANGIOGRAPHY CHEST WITH CONTRAST
TECHNIQUE: Multidetector CT imaging of the chest was performed using the
standard protocol during bolus administration of intravenous
contrast. Multiplanar CT image reconstructions and MIPs were
obtained to evaluate the vascular anatomy.
CONTRAST:  100mL OMNIPAQUE IOHEXOL 350 MG/ML SOLN

[Series 5: thins · axial · 0.78mm/px · z∈[-45,+212]mm · 15 of 295 slices shown]
[im 19/295  lung]
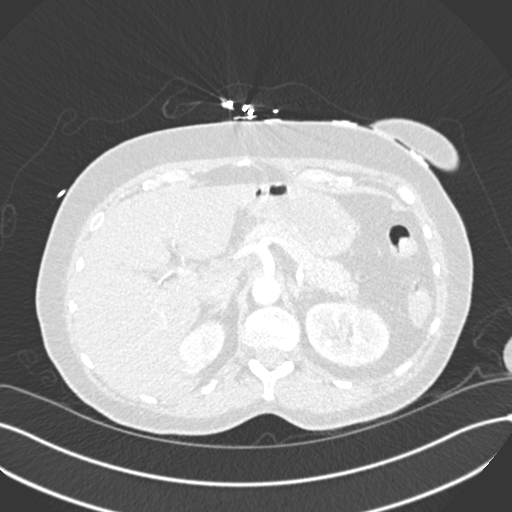
[im 37/295  soft-tissue]
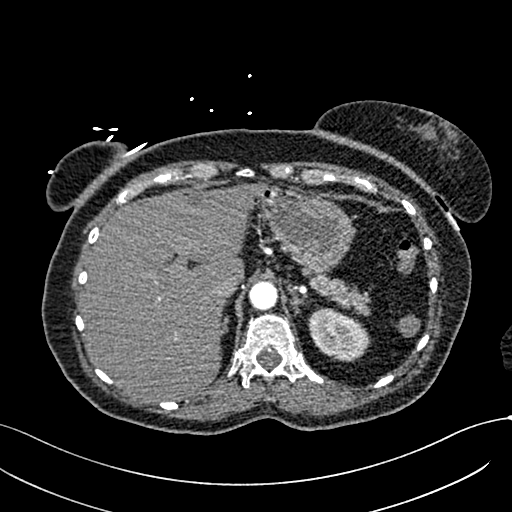
[im 56/295  lung]
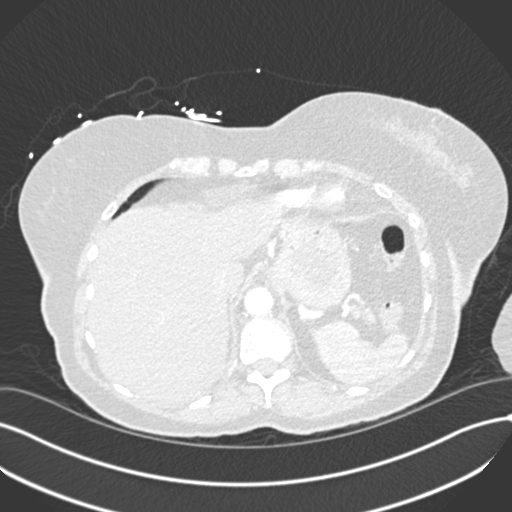
[im 74/295  soft-tissue]
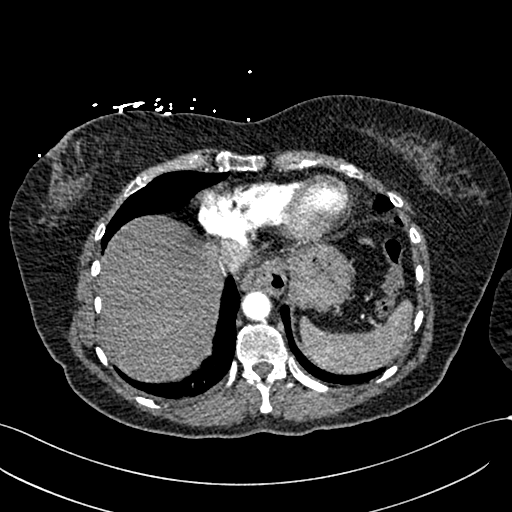
[im 92/295  lung]
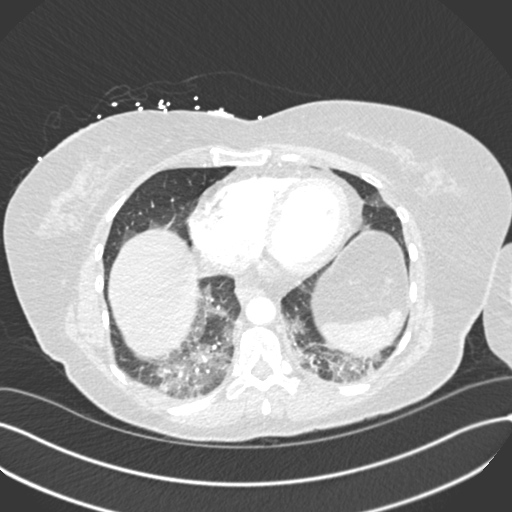
[im 111/295  soft-tissue]
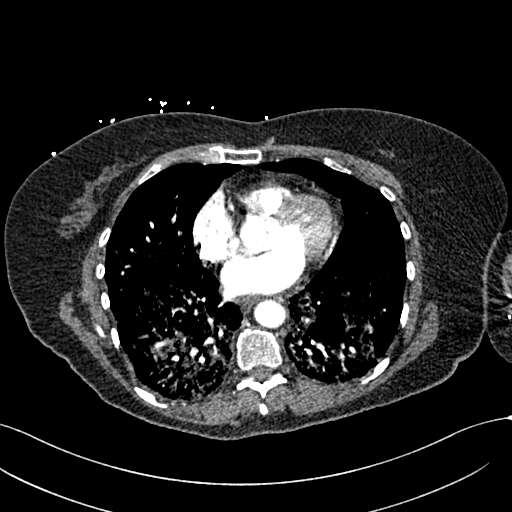
[im 129/295  lung]
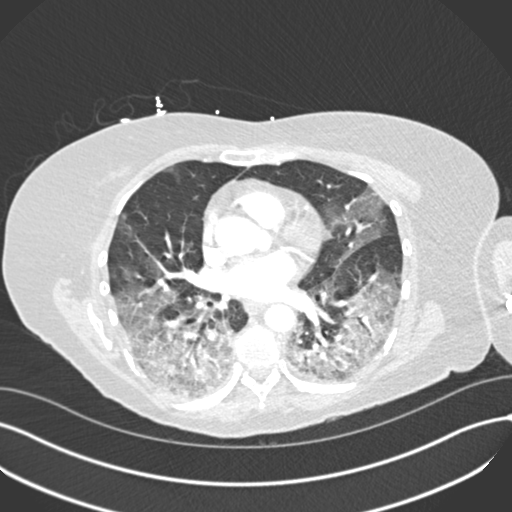
[im 148/295  soft-tissue]
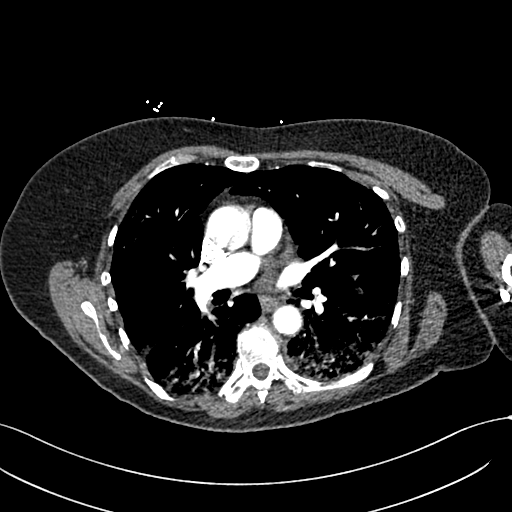
[im 166/295  lung]
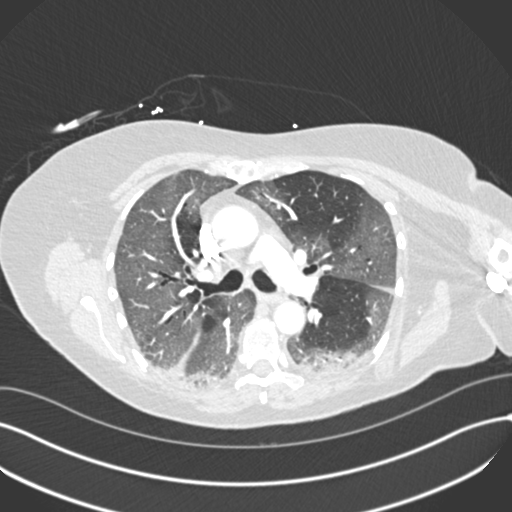
[im 184/295  soft-tissue]
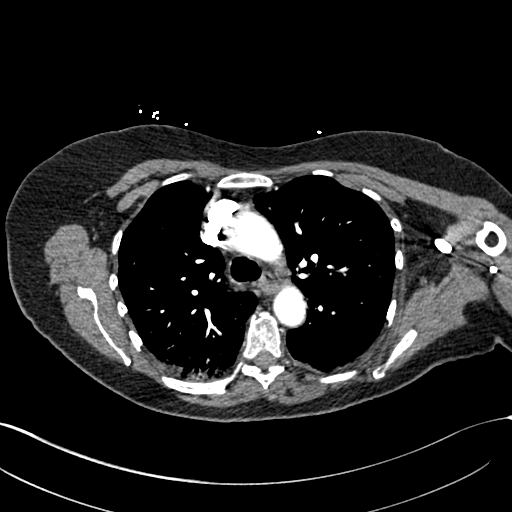
[im 203/295  lung]
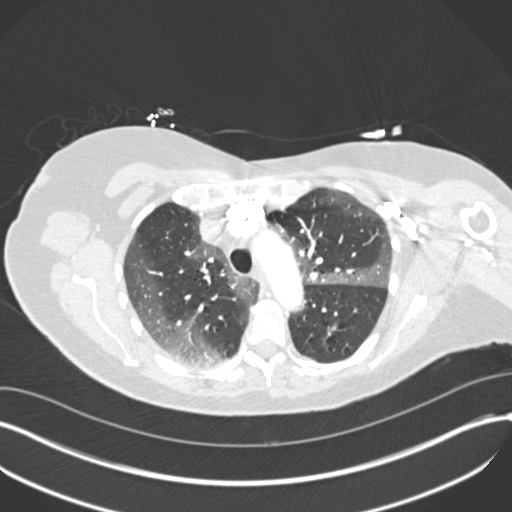
[im 221/295  soft-tissue]
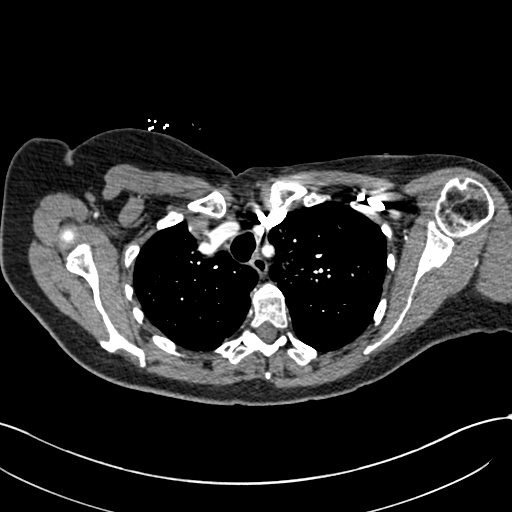
[im 239/295  lung]
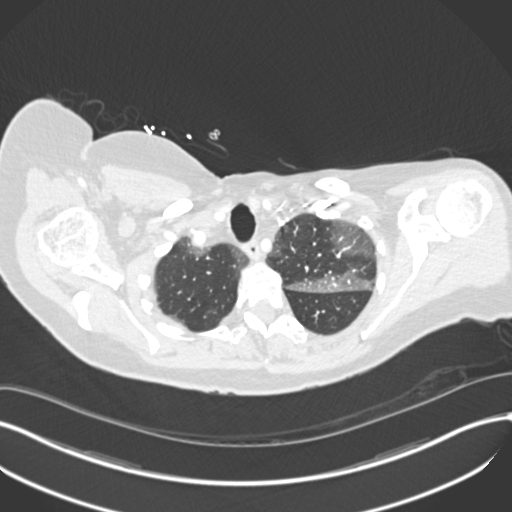
[im 258/295  soft-tissue]
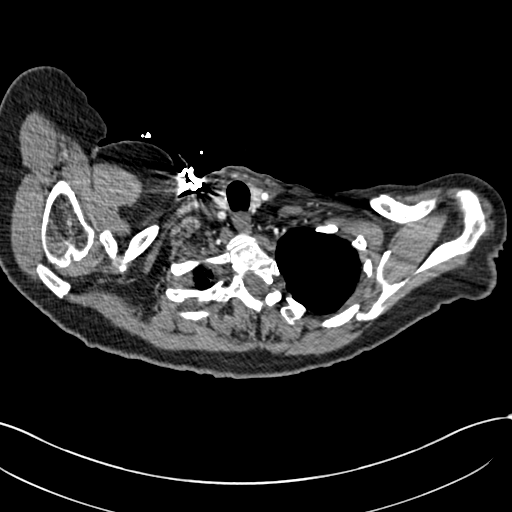
[im 276/295  lung]
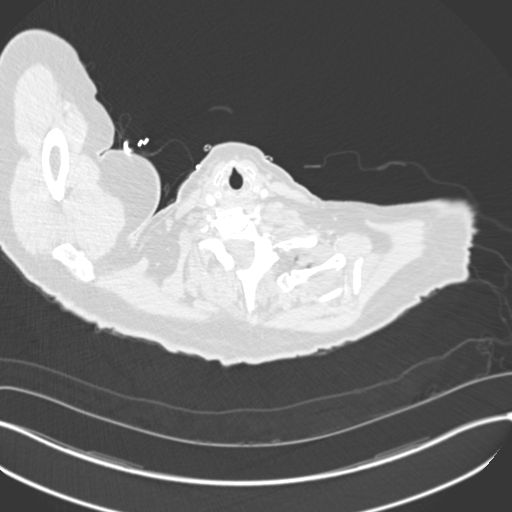

[Series 6: lung · axial · 0.78mm/px · z∈[-27,+9]mm · 2 of 148 slices shown]
[im 19/148  soft-tissue]
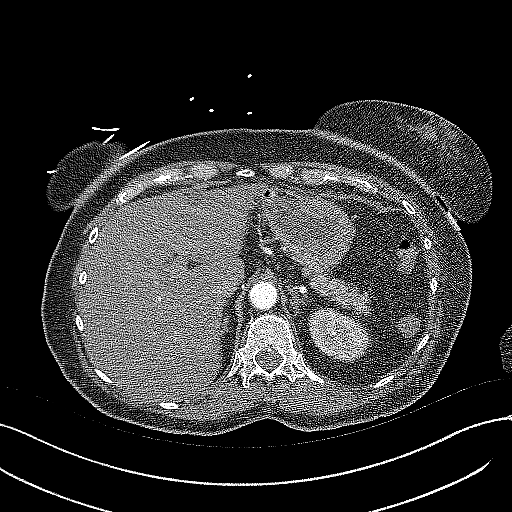
[im 37/148  soft-tissue]
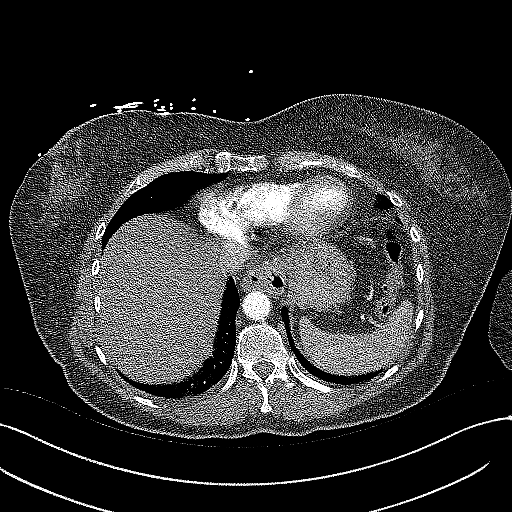

[Series 7: coronal mpr · coronal · 0.58mm/px · 2 of 83 slices shown]
[im 28/83  soft-tissue]
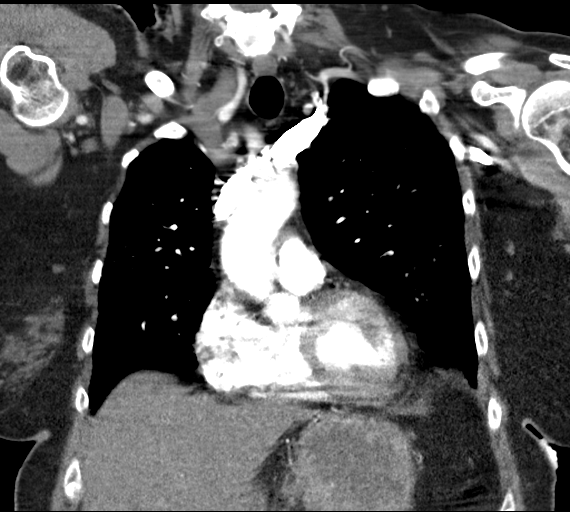
[im 55/83  soft-tissue]
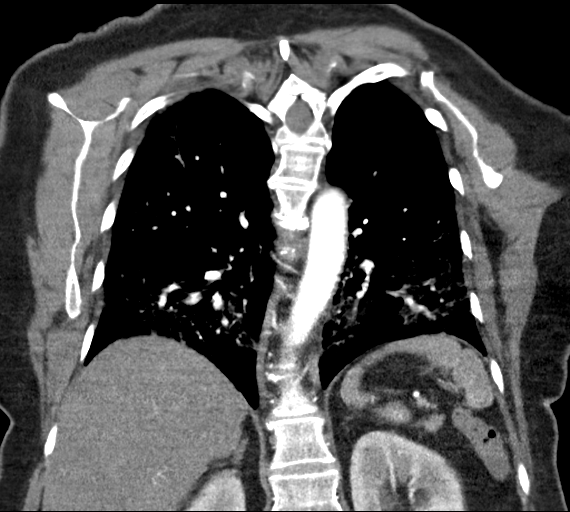

[19 of 46 positions shown; findings below may reference images not displayed]

FINDINGS: Cardiovascular: Satisfactory opacification of the pulmonary arteries
to the segmental level. No evidence of pulmonary embolism. Normal
heart size. No pericardial effusion.

Mediastinum/Nodes: No enlarged mediastinal, hilar, or axillary lymph
nodes. Thyroid gland, trachea, and esophagus demonstrate no
significant findings.

Lungs/Pleura: No pneumothorax or pleural effusion is noted. Patchy
airspace opacities are noted throughout both lungs concerning for
multifocal pneumonia.

Upper Abdomen: No acute abnormality.

Musculoskeletal: No chest wall abnormality. No acute or significant
osseous findings.

Review of the MIP images confirms the above findings.
IMPRESSION: 1. No definite evidence of pulmonary embolus.
2. Patchy airspace opacities are noted throughout both lungs
concerning for multifocal pneumonia.

## 2020-11-14 IMAGING — DX DG CHEST 1V PORT
1 series · 1 of 1 positions shown · non-contrast
Comparison: November 18, 2019 and CT angiogram chest November 22, 2019

CLINICAL DATA: Shortness of breath.  75QCC-F0 positive

EXAM:
PORTABLE CHEST 1 VIEW

[chest ap]
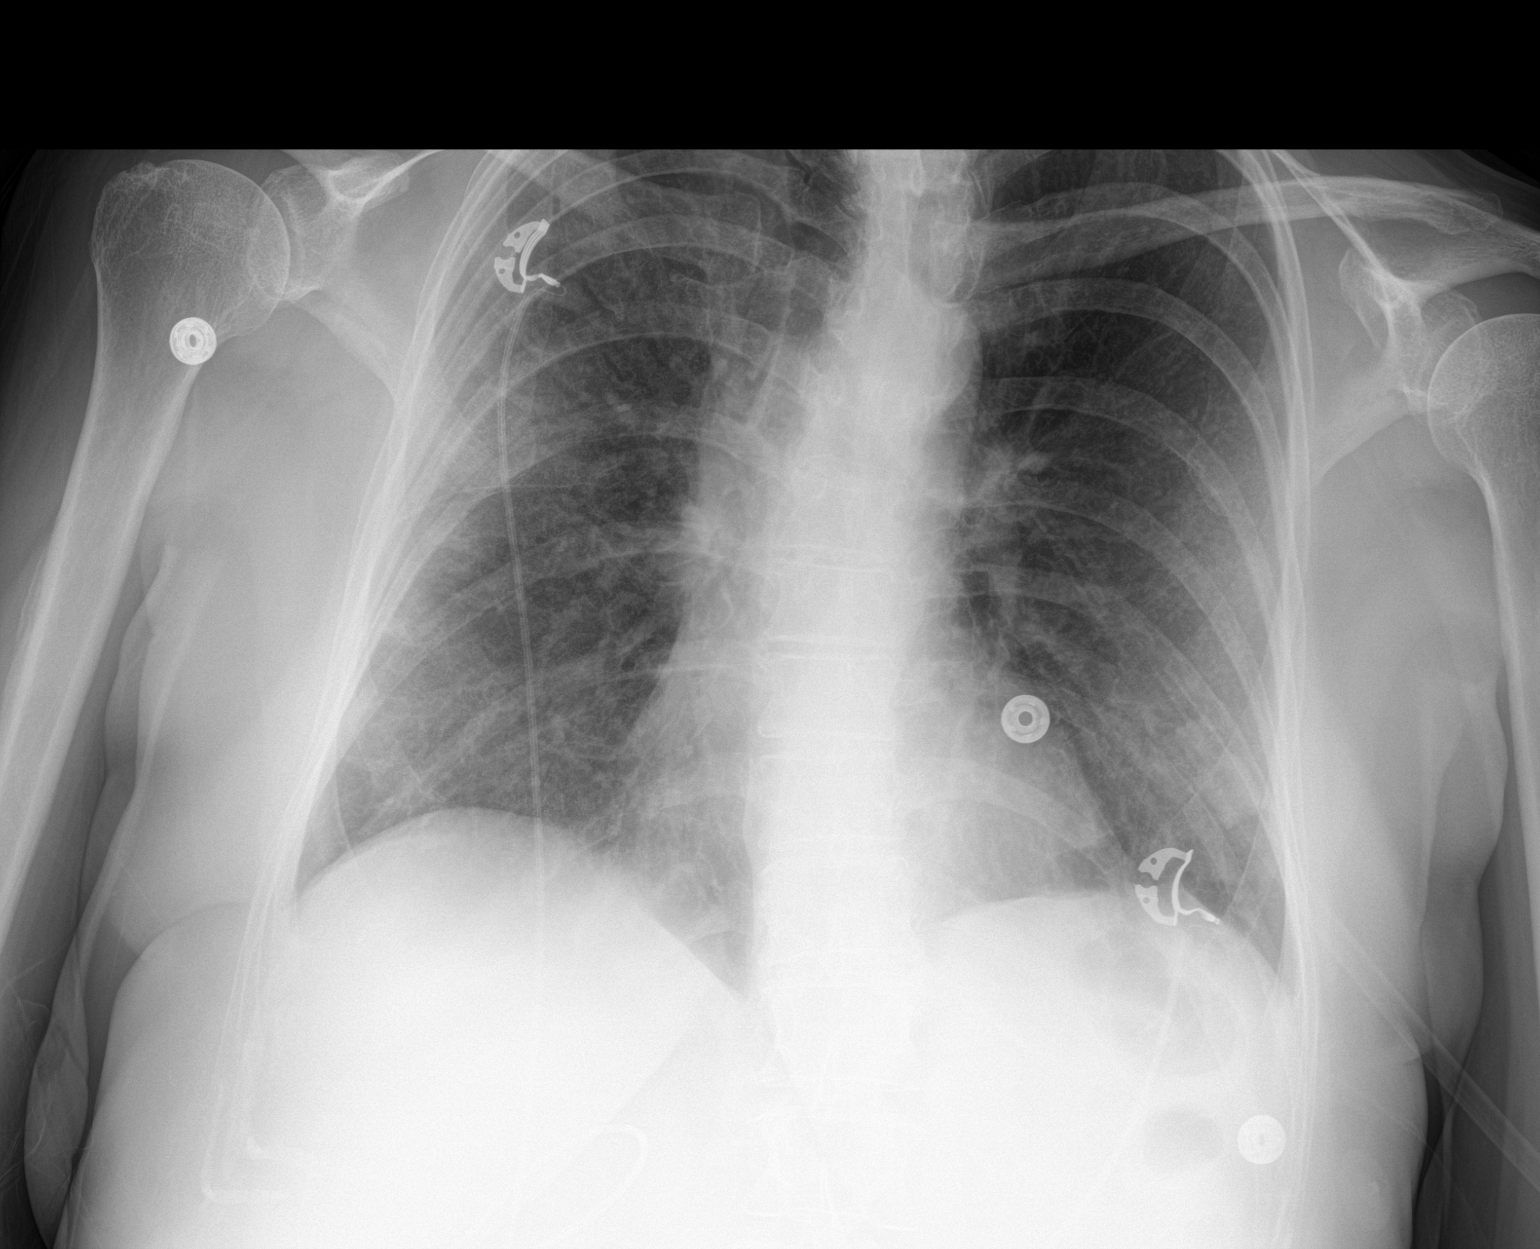

[1 of 1 positions shown; findings below may reference images not displayed]

FINDINGS: Ill-defined areas of airspace opacity persist without progression
compared to most recent CT examination. Marginally less opacity
noted compared to most recent chest radiograph. No consolidation
evident. Heart size and pulmonary vascularity are normal. No
adenopathy. No bone lesions.
IMPRESSION: Ill-defined areas of airspace opacity likely representing multifocal
atypical organism pneumonia. Slightly less opacity overall compared
to most recent chest radiograph with essentially stable appearance
compared to CT from 2 days prior. No new areas of airspace opacity.
No consolidation.

Cardiac silhouette normal.  No adenopathy

## 2020-11-28 DIAGNOSIS — D493 Neoplasm of unspecified behavior of breast: Secondary | ICD-10-CM | POA: Diagnosis not present

## 2021-01-31 DIAGNOSIS — L578 Other skin changes due to chronic exposure to nonionizing radiation: Secondary | ICD-10-CM | POA: Diagnosis not present

## 2021-01-31 DIAGNOSIS — L821 Other seborrheic keratosis: Secondary | ICD-10-CM | POA: Diagnosis not present

## 2021-01-31 DIAGNOSIS — L209 Atopic dermatitis, unspecified: Secondary | ICD-10-CM | POA: Diagnosis not present

## 2021-01-31 DIAGNOSIS — L570 Actinic keratosis: Secondary | ICD-10-CM | POA: Diagnosis not present

## 2022-09-27 ENCOUNTER — Encounter (HOSPITAL_BASED_OUTPATIENT_CLINIC_OR_DEPARTMENT_OTHER): Payer: Self-pay | Admitting: Emergency Medicine

## 2022-09-27 ENCOUNTER — Emergency Department (HOSPITAL_BASED_OUTPATIENT_CLINIC_OR_DEPARTMENT_OTHER)
Admission: EM | Admit: 2022-09-27 | Discharge: 2022-09-27 | Disposition: A | Discharge status: Home / Self Care | Payer: PPO | Attending: Emergency Medicine | Admitting: Emergency Medicine

## 2022-09-27 DIAGNOSIS — Z23 Encounter for immunization: Secondary | ICD-10-CM | POA: Diagnosis not present

## 2022-09-27 DIAGNOSIS — W540XXA Bitten by dog, initial encounter: Secondary | ICD-10-CM | POA: Insufficient documentation

## 2022-09-27 DIAGNOSIS — S61451A Open bite of right hand, initial encounter: Secondary | ICD-10-CM | POA: Insufficient documentation

## 2022-09-27 DIAGNOSIS — Z91040 Latex allergy status: Secondary | ICD-10-CM | POA: Insufficient documentation

## 2022-09-27 DIAGNOSIS — S6991XA Unspecified injury of right wrist, hand and finger(s), initial encounter: Secondary | ICD-10-CM | POA: Diagnosis present

## 2022-09-27 MED ORDER — AMOXICILLIN-POT CLAVULANATE 875-125 MG PO TABS: 1.0000 | ORAL_TABLET | Freq: Two times a day (BID) | ORAL | 0 refills | Status: DC

## 2022-09-27 MED ORDER — TETANUS-DIPHTH-ACELL PERTUSSIS 5-2.5-18.5 LF-MCG/0.5 IM SUSY
0.5000 mL | PREFILLED_SYRINGE | Freq: Once | INTRAMUSCULAR | Status: AC
  Administered 2022-09-27: 0.5 mL via INTRAMUSCULAR
  Filled 2022-09-27: qty 0.5

## 2022-09-27 MED ORDER — LACTATED RINGERS IV BOLUS: 1000.0000 mL | Freq: Once | INTRAVENOUS | Status: DC

## 2022-09-27 NOTE — ED Notes (Signed)
Wound cleansed with NS, Steri strips placed, dry dressing as well. Pt instructed on skin/wound care. Stated understanding. Dressing supplies sent with patient.

## 2022-09-27 NOTE — ED Provider Notes (Signed)
[place] EMERGENCY DEPARTMENT AT [facility] Provider Note   CSN: [id number] Arrival date & time: 09/27/22  1021     History  Chief Complaint  Patient presents with   Animal Bite    [name] [name] is a 72 y.o. female.  HPI   72 year old female presents today after dog bite that occurred earlier today to her right hand.  This is her dog which has its immunizations up-to-date.  The dog was playing and was not being aggressive.  She has a flap to the back of the right hand.  She is not on any blood thinners.  She cleaned it at home and placed antibiotic ointment.  She does not know when her last tetanus shot was.  She has full active movement and sensation in her right hand.  Home Medications Prior to Admission medications   Medication Sig Start Date End Date Taking? Authorizing Provider  amoxicillin-clavulanate (AUGMENTIN) 875-125 MG tablet Take 1 tablet by mouth every 12 (twelve) hours. 09/27/22  Yes Margarita Grizzle, MD  albuterol [facility] HFA) 108 (90 Base) MCG/ACT inhaler Inhale 2 puffs into the lungs every 4 (four) hours as needed for wheezing or shortness of breath. 11/30/19   Dorcas Carrow, MD  ascorbic acid (VITAMIN C) 500 MG tablet Take 1 tablet (500 mg total) by mouth daily. 11/30/19   Rolly Salter, MD  calcium carbonate (OS-CAL) 600 MG TABS Take 600 mg by mouth daily.    [provider]  Cholecalciferol (VITAMIN D3) 1000 UNITS tablet Take 1,000 Units by mouth daily.      [provider]  fish oil-omega-3 fatty acids 1000 MG capsule Take 1 g by mouth daily.     [provider]  fluticasone (FLONASE) 50 MCG/ACT nasal spray Place 1 spray into both nostrils daily as needed for allergies.     [provider]  metFORMIN (GLUCOPHAGE-XR) 500 MG 24 hr tablet Take 500 mg by mouth in the morning and at bedtime.  07/07/19   [provider]  Multiple Vitamin (MULTIVITAMIN) tablet Take 1 tablet by mouth daily.    [provider]   Multiple Vitamins-Minerals (EMERGEN-C IMMUNE PO) Take 1,000-2,000 mg by mouth See admin instructions. Take 2000mg  in the morning, 2000mg  in the afternoon and 1000mg  at night    [provider]  Multiple Vitamins-Minerals (ZINC PO) Take 1 capsule by mouth daily.    [provider]  nystatin (MYCOSTATIN) 100000 UNIT/ML suspension Take 5 mLs (500,000 Units total) by mouth 4 (four) times daily. 11/29/19   Rolly Salter, MD  omeprazole (PRILOSEC OTC) 20 MG tablet Take 20 mg by mouth daily as needed (for heartburn).     [provider]  OVER THE COUNTER MEDICATION Take 1 capsule by mouth daily. "Quercertin"    [provider]  polyethylene glycol (MIRALAX / GLYCOLAX) 17 g packet Take 17 g by mouth daily as needed for mild constipation. 11/29/19   Rolly Salter, MD  predniSONE (DELTASONE) 10 MG tablet Take 50mg  daily for 3days,Take 40mg  daily for 3days,Take 30mg  daily for 3days,Take 20mg  daily for 3days,Take 10mg  daily for 3days, then stop 11/29/19   Rolly Salter, MD  SALINE NA Place 1 packet into the nose 2 (two) times daily as needed (dry nose).    [provider]      Allergies    Ciprofloxacin, Latex, Other, and Sulfonamide derivatives    Review of Systems   Review of Systems  Physical Exam Updated Vital Signs  BP (!) 142/72   Pulse 67   Temp 98.3 F (36.8 C) (Oral)   Resp 16   Ht 1.651 m (5\' 5" )   Wt 71.7 kg   SpO2 97%   BMI 26.29 kg/m  Physical Exam Vitals and nursing note reviewed.  Constitutional:      General: She is not in acute distress.    Appearance: She is well-developed.  HENT:     Head: Normocephalic and atraumatic.     Right Ear: External ear normal.     Left Ear: External ear normal.     Nose: Nose normal.  Eyes:     Conjunctiva/sclera: Conjunctivae normal.     Pupils: Pupils are equal, round, and reactive to light.  Pulmonary:     Effort: Pulmonary effort is normal.  Musculoskeletal:        General: Normal range  of motion.     Cervical back: Normal range of motion and neck supple.     Comments: Dorsal aspect right hand with triangular flap 2 cm proximal to second and third MCP joints. Wound visually explored through range of motion and no tendon involvement Full active range of motion of fingers and sensation intact   Skin:    General: Skin is warm and dry.     Capillary Refill: Capillary refill takes less than 2 seconds.  Neurological:     Mental Status: She is alert and oriented to person, place, and time.     Motor: No abnormal muscle tone.     Coordination: Coordination normal.  Psychiatric:        Behavior: Behavior normal.        Thought Content: Thought content normal.     ED Results / Procedures / Treatments   Labs (all labs ordered are listed, but only abnormal results are displayed) Labs Reviewed - No data to display  EKG None  Radiology No results found.  Procedures Procedures    Medications Ordered in ED Medications  Tdap (BOOSTRIX) injection 0.5 mL (has no administration in time range)    ED Course/ Medical Decision Making/ A&P                             Medical Decision Making  Dog bite to right hand Dog with known immunizations up-to-date Patient's tetanus updated Wound cleaned here in ED and Steri-Strips and splint placed.  Discussed placing sutures and increased risk for infection. Discussed signs symptoms of infection and need for recheck will place prescription for Augmentin.        Final Clinical Impression(s) / ED Diagnoses Final diagnoses:  Dog bite of right hand, initial encounter    Rx / DC Orders ED Discharge Orders          Ordered    amoxicillin-clavulanate (AUGMENTIN) 875-125 MG tablet  Every 12 hours        09/27/22 1118              Margarita Grizzle, MD 09/27/22 1118

## 2022-09-27 NOTE — ED Notes (Signed)
Dc instructions reviewed with patient. Patient voiced understanding. Dc with belongings.  °

## 2022-09-27 NOTE — ED Triage Notes (Signed)
Pt arrives pov, steady gait c/o dog bite to RT hand while playing with puppy today. Pts dog, UTD on vaccines. Pt is unsure of last tetanus. Bleeding controlled. Steri-strips applied pta

## 2022-09-27 NOTE — Discharge Instructions (Signed)
Please remove dressing and visually inspect at least twice per day Signs and symptoms of infection are increased redness, discharge, increased pain.  Please be reevaluated if you have any signs of infection. Please have wound rechecked at your doctor next week.

## 2023-05-27 NOTE — Progress Notes (Addendum)
 Chief Complaint:discuss colon Primary GI Doctor:Dr. Leone Payor  HPI:  Patient is a  73  year old female patient with past medical history of 2 diabetes, asthma, degenerative disc disease, GERD, who presents to discuss colonoscopy.  08/26/2011 patient last seen in GI office with Dr. Leone Payor for GERD problems.  She was using Prilosec 20 mg daily and that controls her heartburn well. Also discussed colon screening. Last colonoscopy 2007 with diminutive left-sided hyperplastic polyp but incomplete exam. Hemoccults are negative on an annual basis. I explained that that is in acceptable way to screen for colorectal cancer. She is considering CT versus optical colonoscopy and will let us know if she chooses to do one of those.   Interval History     Patient also presents to discuss colon screening colonoscopy. She had one back in 2007 with some difficulty along passing a scope. Her PCP has been doing yearly hemoccults which are negative. We spent several minutes discussing the procedures and answering any questions or concerns she has.   She takes daily Benefiber to regulate her bowels.     Patient has history of GERD and taking omeprazole 20 mg p.o. daily. If she skips the medication she has reflux. Patient denies dysphagia.  Patient's family history includes one uncle with Colon CA in his 44s and mother with diverticulitis.  Wt Readings from Last 3 Encounters:  09/27/22 158 lb (71.7 kg)  11/18/19 165 lb (74.8 kg)  09/10/16 173 lb (78.5 kg)    Past Medical History:  Diagnosis Date   Allergic rhinitis    Anxiety and depression    Arthritis    neck   Bone spur    neck   Cataract    L. eye   Chronic sinusitis    CT sinuses 2012, moderately severe.   Diplopia    Diverticulosis    patient denies   GERD (gastroesophageal reflux disease)    Herniated lumbar intervertebral disc    Hypoglycemia    Ligament tear of lower extremity    L. foot Dr. Antony Odea   Lumbago    and radiculopathy    Mild persistent asthma    Sessile colonic polyp 2007   4mm    Past Surgical History:  Procedure Laterality Date   ANTERIOR (CYSTOCELE) AND POSTERIOR REPAIR (RECTOCELE) WITH XENFORM GRAFT AND SACROSPINOUS FIXATION N/A 12/25/2014   Procedure: ANTERIOR (CYSTOCELE) AND POSTERIOR REPAIR (RECTOCELE) ;  Surgeon: Tracey Harries, MD;  Location: [facility];  Service: Gynecology;  Laterality: N/A;   COLONOSCOPY W/ POLYPECTOMY  11/2005   INCOMPLETE - to transverse colon  - diverticulosis, 4mm polyp was hyperplastic   FOOT SURGERY     left - Dr. Lestine Box 2009   LAPAROSCOPIC SALPINGOOPHERECTOMY  05/05/2006   LSO for complex left adnexal mass:  PATH--serous cystadenofibroma   SHOULDER SURGERY     impingement syndrome   TUBAL LIGATION     UPPER GASTROINTESTINAL ENDOSCOPY  06/2002,    with dilation, esophageal stenosis   VAGINAL HYSTERECTOMY N/A 12/25/2014   Procedure: HYSTERECTOMY VAGINAL;  Surgeon: Tracey Harries, MD;  Location: [facility];  Service: Gynecology;  Laterality: N/A;    Current Outpatient Medications  Medication Sig Dispense Refill   albuterol (PROAIR HFA) 108 (90 Base) MCG/ACT inhaler Inhale 2 puffs into the lungs every 4 (four) hours as needed for wheezing or shortness of breath. 8 g 1   amoxicillin-clavulanate (AUGMENTIN) 875-125 MG tablet Take 1 tablet by mouth every 12 (twelve) hours. 10 tablet 0   ascorbic acid (  VITAMIN C) 500 MG tablet Take 1 tablet (500 mg total) by mouth daily. 30 tablet 0   calcium carbonate (OS-CAL) 600 MG TABS Take 600 mg by mouth daily.     Cholecalciferol (VITAMIN D3) 1000 UNITS tablet Take 1,000 Units by mouth daily.       fish oil-omega-3 fatty acids 1000 MG capsule Take 1 g by mouth daily.      fluticasone (FLONASE) 50 MCG/ACT nasal spray Place 1 spray into both nostrils daily as needed for allergies.      metFORMIN (GLUCOPHAGE-XR) 500 MG 24 hr tablet Take 500 mg by mouth in the morning and at bedtime.      Multiple Vitamin (MULTIVITAMIN) tablet Take 1 tablet by  mouth daily.     Multiple Vitamins-Minerals (EMERGEN-C IMMUNE PO) Take 1,000-2,000 mg by mouth See admin instructions. Take 2000mg  in the morning, 2000mg  in the afternoon and 1000mg  at night     Multiple Vitamins-Minerals (ZINC PO) Take 1 capsule by mouth daily.     nystatin (MYCOSTATIN) 100000 UNIT/ML suspension Take 5 mLs (500,000 Units total) by mouth 4 (four) times daily. 60 mL 0   omeprazole (PRILOSEC OTC) 20 MG tablet Take 20 mg by mouth daily as needed (for heartburn).      OVER THE COUNTER MEDICATION Take 1 capsule by mouth daily. "Quercertin"     polyethylene glycol (MIRALAX / GLYCOLAX) 17 g packet Take 17 g by mouth daily as needed for mild constipation. 14 each 0   predniSONE (DELTASONE) 10 MG tablet Take 50mg  daily for 3days,Take 40mg  daily for 3days,Take 30mg  daily for 3days,Take 20mg  daily for 3days,Take 10mg  daily for 3days, then stop 45 tablet 0   SALINE NA Place 1 packet into the nose 2 (two) times daily as needed (dry nose).     No current facility-administered medications for this visit.    Allergies as of 05/28/2023 - Reviewed 09/27/2022  Allergen Reaction Noted   Ciprofloxacin Other (See Comments) 12/17/2015   Latex Itching 11/18/2019   Other Other (See Comments)    Sulfonamide derivatives Other (See Comments)     Family History  Problem Relation Age of Onset   Heart disease Mother 39       CHF with valvular dz   Multiple myeloma Mother    Colon polyps Mother    Diabetes Mother    Cancer Mother        multiple myel   Breast cancer Other        cousin   Lung cancer Other        uncle   Heart disease Brother        one with aortic valve dz and one with CAD   Colon cancer Neg Hx     Review of Systems:    Constitutional: No weight loss, fever, chills, weakness or fatigue HEENT: Eyes: No change in vision               Ears, Nose, Throat:  No change in hearing or congestion Skin: No rash or itching Cardiovascular: No chest pain, chest pressure or  palpitations   Respiratory: No SOB or cough Gastrointestinal: See HPI and otherwise negative Genitourinary: No dysuria or change in urinary frequency Neurological: No headache, dizziness or syncope Musculoskeletal: No new muscle or joint pain Hematologic: No bleeding or bruising Psychiatric: No history of depression or anxiety    Physical Exam:  Vital signs: There were no vitals taken for this visit.  Constitutional:   Pleasant  female  appears to be in NAD, Well developed, Well nourished, alert and cooperative Throat: Oral cavity and pharynx without inflammation, swelling or lesion.  Respiratory: Respirations even and unlabored. Lungs clear to auscultation bilaterally.   No wheezes, crackles, or rhonchi.  Cardiovascular: Normal S1, S2. Regular rate and rhythm. No peripheral edema, cyanosis or pallor.  Gastrointestinal:  Soft, nondistended, nontender. No rebound or guarding. Normal bowel sounds. No appreciable masses or hepatomegaly. Rectal:  Not performed.  Msk:  Symmetrical without gross deformities. Without edema, no deformity or joint abnormality.  Neurologic:  Alert and  oriented x4;  grossly normal neurologically.  Skin:   Dry and intact without significant lesions or rashes. Psychiatric: Oriented to person, place and time. Demonstrates good judgement and reason without abnormal affect or behaviors.  RELEVANT LABS AND IMAGING: CBC    Latest Ref Rng & Units 12/01/2019    4:17 AM 11/30/2019    4:07 AM 11/29/2019    4:15 AM  CBC  WBC 4.0 - 10.5 K/uL 16.4  14.8  18.4   Hemoglobin 12.0 - 15.0 g/dL 81.1  91.4  78.2   Hematocrit 36.0 - 46.0 % 35.5  35.4  35.6   Platelets 150 - 400 K/uL 240  265  219      CMP     Latest Ref Rng & Units 11/28/2019    5:05 AM 11/27/2019    6:32 AM 11/26/2019    5:43 AM  CMP  Glucose 70 - 99 mg/dL 956  213  086   BUN 8 - 23 mg/dL 20  21  24    Creatinine 0.44 - 1.00 mg/dL 5.78  4.69  6.29   Sodium 135 - 145 mmol/L 138  139  138   Potassium 3.5 -  5.1 mmol/L 4.6  5.0  4.4   Chloride 98 - 111 mmol/L 103  104  101   CO2 22 - 32 mmol/L 26  27  27    Calcium 8.9 - 10.3 mg/dL 8.2  8.3  8.0   Total Protein 6.5 - 8.1 g/dL 5.4  5.3  5.4   Total Bilirubin 0.3 - 1.2 mg/dL 0.4  0.5  0.5   Alkaline Phos 38 - 126 U/L 40  40  41   AST 15 - 41 U/L 18  22  21    ALT 0 - 44 U/L 31  28  27       Lab Results  Component Value Date   TSH 2.25 10/15/2011  11/27/2005 colonoscopy with Dr. Leone Payor, recall 10 years Normal exam transverse colon to descending colon Diverticulosis in sigmoid colon Polyp in sigmoid colon 4 mm sessile polyp, snare without cautery removed retrieved Mild trauma found in rectum rectosigmoid Normal exam in rectum --She previously had a colonoscopy in 2007, moderate sedation was not completely adequate, the colon was redundant and she had some pain in the left side and it was not completed. She has not schedule followup or repeat colonoscopy.   Assessment: Encounter Diagnoses  Name Primary?   Gastroesophageal reflux disease, unspecified whether esophagitis present Yes   History of colonic polyps      73 year old female patient with history of GERD, controlled with Omeprazole 20 mg po daily. No dysphagia. Medication refilled.    Patient also due for colon screening colonoscopy. She had difficult colonoscopy in 2007, will make note on order to possibly have pediatric scope on hand for procedure. All questions and concerns answered, she agrees to proceed.  Plan: -Continue Omeprazole 20mg  po daily,  refilled. -Schedule for a colonoscopy in [facility] with Dr. Leone Payor. The risks and benefits of colonoscopy with possible polypectomy / biopsies were discussed and the patient agrees to proceed.     Thank you for the courtesy of this consult. Please call me with any questions or concerns.   Latavia Goga, FNP-C [facility] 05/27/2023, 4:46 PM  Cc: Randel Pigg, Dorma Russell, MD  Primary gastroenterologist:  Patient w/ hx incomplete  colonoscopy - redundant colon and inadequate sedation. Instead of pediatric scope would plan on adult scope + abdominal binder.  Iva Boop, MD, [facility]

## 2023-05-28 ENCOUNTER — Ambulatory Visit: Payer: PPO | Admitting: Gastroenterology

## 2023-05-28 ENCOUNTER — Encounter: Payer: Self-pay | Admitting: Gastroenterology

## 2023-05-28 VITALS — BP 94/62 | HR 88 | Ht 64.0 in | Wt 167.4 lb

## 2023-05-28 DIAGNOSIS — K219 Gastro-esophageal reflux disease without esophagitis: Secondary | ICD-10-CM | POA: Diagnosis not present

## 2023-05-28 DIAGNOSIS — Z860100 Personal history of colon polyps, unspecified: Secondary | ICD-10-CM | POA: Diagnosis not present

## 2023-05-28 MED ORDER — OMEPRAZOLE 20 MG PO CPDR
20.0000 mg | DELAYED_RELEASE_CAPSULE | Freq: Every day | ORAL | 3 refills | Status: AC
Start: 2023-05-28 — End: ?

## 2023-05-28 MED ORDER — NA SULFATE-K SULFATE-MG SULF 17.5-3.13-1.6 GM/177ML PO SOLN: 1.0000 | Freq: Once | ORAL | 0 refills | Status: AC

## 2023-05-28 NOTE — Patient Instructions (Addendum)
 _______________________________________________________  If your blood pressure at your visit was 140/90 or greater, please contact your primary care physician to follow up on this.  _______________________________________________________  If you are age 71 or older, your body mass index should be between 23-30. Your Body mass index is 28.73 kg/m. If this is out of the aforementioned range listed, please consider follow up with your Primary Care Provider.  If you are age 48 or younger, your body mass index should be between 19-25. Your Body mass index is 28.73 kg/m. If this is out of the aformentioned range listed, please consider follow up with your Primary Care Provider.   ________________________________________________________  The [facility] providers would like to encourage you to use [facility] to communicate with providers for non-urgent requests or questions.  Due to long hold times on the telephone, sending your provider a message by [facility] may be a faster and more efficient way to get a response.  Please allow 48 business hours for a response.  Please remember that this is for non-urgent requests.  _______________________________________________________  We have sent the following medications to your pharmacy for you to pick up at your convenience: Omeprazole  Suprep  You have been scheduled for a colonoscopy. Please follow written instructions given to you at your visit today.   If you use inhalers (even only as needed), please bring them with you on the day of your procedure.  DO NOT TAKE 7 DAYS PRIOR TO TEST- Trulicity (dulaglutide) Ozempic, Wegovy (semaglutide) Mounjaro (tirzepatide) Bydureon Bcise (exanatide extended release)  DO NOT TAKE 1 DAY PRIOR TO YOUR TEST Rybelsus (semaglutide) Adlyxin (lixisenatide) Victoza (liraglutide) Byetta (exanatide) ___________________________________________________________________________  It was a pleasure to see you  today!  Thank you for trusting me with your gastrointestinal care!

## 2023-07-06 ENCOUNTER — Encounter: Payer: Self-pay | Admitting: Internal Medicine

## 2023-07-12 NOTE — Progress Notes (Unsigned)
 [facility] History and Physical   Primary Care Physician:  Ruel Cotta, Heinz Llano, MD   Reason for Procedure:  Colon cancer screening  Plan:    Colonoscopy-consider abdominal binder due to redundant colon     HPI: [name] [name] is a 73 y.o. female presenting for a screening colonoscopy.  Colonoscopy in 2007 as below.  That was with moderate sedation which I think was inadequate.  It was significantly redundant in the left colon that precluded complete colonoscopy as well.  She has done Hemoccults in the interim.  11/27/2005 colonoscopy with Dr. Willy Harvest, recall 10 years Normal exam transverse colon to descending colon Diverticulosis in sigmoid colon Polyp in sigmoid colon 4 mm sessile polyp, snare without cautery removed retrieved-hyperplastic Mild trauma found in rectum rectosigmoid Normal exam in rectum    Past Medical History:  Diagnosis Date   Allergic rhinitis    Anxiety and depression    Arthritis    neck   Bone spur    neck   Cataract    L. eye   Chronic sinusitis    CT sinuses 2012, moderately severe.   COVID    Diplopia    Diverticulosis    patient denies   GERD (gastroesophageal reflux disease)    Herniated lumbar intervertebral disc    Hyperplastic colonic polyp 2007   Hypoglycemia    Ligament tear of lower extremity    L. foot Dr. Alvester Aw   Lumbago    and radiculopathy   Mild persistent asthma    Pneumonia    Prediabetes     Past Surgical History:  Procedure Laterality Date   ANTERIOR (CYSTOCELE) AND POSTERIOR REPAIR (RECTOCELE) WITH XENFORM GRAFT AND SACROSPINOUS FIXATION N/A 12/25/2014   Procedure: ANTERIOR (CYSTOCELE) AND POSTERIOR REPAIR (RECTOCELE) ;  Surgeon: Dolph Friar, MD;  Location: [facility];  Service: Gynecology;  Laterality: N/A;   COLONOSCOPY W/ POLYPECTOMY  11/2005   INCOMPLETE - to transverse colon  - diverticulosis, 4mm polyp was hyperplastic   FOOT SURGERY Left    left - Dr. Ronda Cocks 2009   LAPAROSCOPIC  SALPINGOOPHERECTOMY  05/05/2006   LSO for complex left adnexal mass:  PATH--serous cystadenofibroma   SHOULDER SURGERY Left    impingement syndrome   TUBAL LIGATION     UPPER GASTROINTESTINAL ENDOSCOPY  06/2002   with dilation, esophageal stenosis   VAGINAL HYSTERECTOMY N/A 12/25/2014   Procedure: HYSTERECTOMY VAGINAL;  Surgeon: Dolph Friar, MD;  Location: [facility];  Service: Gynecology;  Laterality: N/A;    Prior to Admission medications   Medication Sig Start Date End Date Taking? Authorizing Provider  albuterol  (PROAIR  HFA) 108 (90 Base) MCG/ACT inhaler Inhale 2 puffs into the lungs every 4 (four) hours as needed for wheezing or shortness of breath. 11/30/19   Vada Garibaldi, MD  ascorbic acid  (VITAMIN C) 500 MG tablet Take 1 tablet (500 mg total) by mouth daily. 11/30/19   Kraig Peru, MD  calcium  carbonate (OS-CAL) 600 MG TABS Take 600 mg by mouth daily.    [provider]  Cholecalciferol  (VITAMIN D3) 1000 UNITS tablet Take 1,000 Units by mouth daily.      [provider]  fish oil-omega-3 fatty acids 1000 MG capsule Take 1 g by mouth daily.     [provider]  fluticasone  (FLONASE ) 50 MCG/ACT nasal spray Place 1 spray into both nostrils daily as needed for allergies.     [provider]  gabapentin (NEURONTIN) 300 MG capsule Take 300 mg by mouth. 1 in  the morning and 2 at night 05/26/23   [provider]  metFORMIN  (GLUCOPHAGE -XR) 500 MG 24 hr tablet Take 500 mg by mouth in the morning and at bedtime.  07/07/19   [provider]  Multiple Vitamin (MULTIVITAMIN) tablet Take 1 tablet by mouth daily.    [provider]  Multiple Vitamins-Minerals (EMERGEN-C IMMUNE PO) Take 1,000-2,000 mg by mouth See admin instructions. Take 2000mg  in the morning, 2000mg  in the afternoon and 1000mg  at night    [provider]  Multiple Vitamins-Minerals (ZINC  PO) Take 1 capsule by mouth daily.    [provider]  omeprazole   (PRILOSEC OTC) 20 MG tablet Take 20 mg by mouth daily as needed (for heartburn).     [provider]  omeprazole  (PRILOSEC) 20 MG capsule Take 1 capsule (20 mg total) by mouth daily. 05/28/23   May, Deanna J, NP  OVER THE COUNTER MEDICATION Take 1 capsule by mouth daily. "Quercertin"    [provider]  SALINE NA Place 1 packet into the nose 2 (two) times daily as needed (dry nose).    [provider]  Wheat Dextrin (BENEFIBER PO) Take 1 Dose by mouth daily.    [provider]    Current Outpatient Medications  Medication Sig Dispense Refill   albuterol  (PROAIR  HFA) 108 (90 Base) MCG/ACT inhaler Inhale 2 puffs into the lungs every 4 (four) hours as needed for wheezing or shortness of breath. 8 g 1   ascorbic acid  (VITAMIN C) 500 MG tablet Take 1 tablet (500 mg total) by mouth daily. 30 tablet 0   calcium  carbonate (OS-CAL) 600 MG TABS Take 600 mg by mouth daily.     Cholecalciferol  (VITAMIN D3) 1000 UNITS tablet Take 1,000 Units by mouth daily.       fish oil-omega-3 fatty acids 1000 MG capsule Take 1 g by mouth daily.      fluticasone  (FLONASE ) 50 MCG/ACT nasal spray Place 1 spray into both nostrils daily as needed for allergies.      gabapentin (NEURONTIN) 300 MG capsule Take 300 mg by mouth. 1 in the morning and 2 at night     metFORMIN  (GLUCOPHAGE -XR) 500 MG 24 hr tablet Take 500 mg by mouth in the morning and at bedtime.      Multiple Vitamin (MULTIVITAMIN) tablet Take 1 tablet by mouth daily.     Multiple Vitamins-Minerals (EMERGEN-C IMMUNE PO) Take 1,000-2,000 mg by mouth See admin instructions. Take 2000mg  in the morning, 2000mg  in the afternoon and 1000mg  at night     Multiple Vitamins-Minerals (ZINC  PO) Take 1 capsule by mouth daily.     omeprazole  (PRILOSEC OTC) 20 MG tablet Take 20 mg by mouth daily as needed (for heartburn).      omeprazole  (PRILOSEC) 20 MG capsule Take 1 capsule (20 mg total) by mouth daily. 90 capsule 3   OVER THE COUNTER  MEDICATION Take 1 capsule by mouth daily. "Quercertin"     SALINE NA Place 1 packet into the nose 2 (two) times daily as needed (dry nose).     Wheat Dextrin (BENEFIBER PO) Take 1 Dose by mouth daily.     No current facility-administered medications for this visit.    Allergies as of 07/13/2023 - Review Complete 05/28/2023  Allergen Reaction Noted   Ciprofloxacin Other (See Comments) 12/17/2015   Latex Itching 11/18/2019   Meloxicam  12/12/2022   Other Other (See Comments)    Sulfonamide derivatives Other (See Comments)    Chlorhexidine  Dermatitis  and Rash 05/23/2020    Family History  Problem Relation Age of Onset   Heart disease Mother 27       CHF with valvular dz   Multiple myeloma Mother    Diverticulitis Mother    Diabetes Mother    Cancer Mother        multiple myel   Bladder Cancer Father    Kidney failure Father    Heart disease Brother        one with aortic valve dz and one with CAD   Other Brother        COP   Dementia Brother    Tuberculosis Maternal Grandmother    Tuberculosis Maternal Grandfather    Breast cancer Other        cousin   Lung cancer Other        uncle   Colon cancer Paternal Uncle    Endometriosis Daughter    Anxiety disorder Daughter     Social History   Socioeconomic History   Marital status: Married    Spouse name: Not on file   Number of children: 1   Years of education: Not on file   Highest education level: Not on file  Occupational History   Occupation: office    Employer: [employer]  Tobacco Use   Smoking status: Never   Smokeless tobacco: Never  Vaping Use   Vaping status: Never Used  Substance and Sexual Activity   Alcohol use: No   Drug use: No   Sexual activity: Not on file  Other Topics Concern   Not on file  Social History Narrative   Married, 1 biolog daughter, 3 step children all grown.   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   Orig from [place] of [place].   Never smoker, no alcohol.  No drug  use.   Exercise is rare for her.  Takes care of her 69 y/o father.   Diet: not limiting calories.   Social Drivers of Health   Financial Resource Strain: Low Risk  (08/08/2020)   Received from [facility] visits prior to 05/03/2022.   Overall Financial Resource Strain (CARDIA)    Difficulty of Paying Living Expenses: Not hard at all  Food Insecurity: Low Risk  (02/10/2023)   [facility] from [facility]   Hunger Vital Sign    Worried About Running Out of Food in the Last Year: Never true    Ran Out of Food in the Last Year: Never true  Transportation Needs: No Transportation Needs (02/10/2023)   Received from [facility]    In the past 12 months, has lack of reliable transportation kept you from medical appointments, meetings, work or from getting things needed for daily living? : No  Physical Activity: Sufficiently Active (08/08/2020)   Received from [facility] visits prior to 05/03/2022.   Exercise Vital Sign    Days of Exercise per Week: 6 days    Minutes of Exercise per Session: 150+ min  Stress: No Stress Concern Present (08/08/2020)   Received from [facility] visits prior to 05/03/2022.   [employer] of Occupational Health - Occupational Stress Questionnaire    Feeling of Stress : Not at all  Social Connections: Socially Integrated (08/08/2020)   Received from [facility] visits prior to 05/03/2022.   Social Connection and Isolation Panel [NHANES]    Frequency of Communication with Friends and Family: Three times  a week    Frequency of Social Gatherings with Friends and Family: More than three times a week    Attends Religious Services: More than 4 times per year    Active Member of Clubs or Organizations: Yes    Attends Banker Meetings: More than 4 times per year    Marital Status: Married  Catering manager Violence: Not At Risk (08/08/2020)   [facility] visits prior to 05/03/2022.   Humiliation, Afraid, Rape, and Kick questionnaire    Fear of Current or Ex-Partner: No    Emotionally Abused: No    Physically Abused: No    Sexually Abused: No    Review of Systems: Positive for *** All other review of systems negative except as mentioned in the HPI.  Physical Exam: Vital signs There were no vitals taken for this visit.  General:   Alert,  Well-developed, well-nourished, pleasant and cooperative in NAD Lungs:  Clear throughout to auscultation.   Heart:  Regular rate and rhythm; no murmurs, clicks, rubs,  or gallops. Abdomen:  Soft, nontender and nondistended. Normal bowel sounds.   Neuro/Psych:  Alert and cooperative. Normal mood and affect. A and O x 3   @Jiro Kiester  Tammie Fall, MD, [facility] [phone] (pager) 07/12/2023 8:27 AM@

## 2023-07-13 ENCOUNTER — Ambulatory Visit: Admitting: Internal Medicine

## 2023-07-13 ENCOUNTER — Encounter: Payer: Self-pay | Admitting: Internal Medicine

## 2023-07-13 VITALS — BP 126/68 | HR 73 | Temp 98.0°F | Resp 17 | Ht 64.0 in | Wt 167.0 lb

## 2023-07-13 DIAGNOSIS — Z860100 Personal history of colon polyps, unspecified: Secondary | ICD-10-CM | POA: Diagnosis not present

## 2023-07-13 DIAGNOSIS — D120 Benign neoplasm of cecum: Secondary | ICD-10-CM | POA: Diagnosis not present

## 2023-07-13 DIAGNOSIS — K5730 Diverticulosis of large intestine without perforation or abscess without bleeding: Secondary | ICD-10-CM

## 2023-07-13 DIAGNOSIS — D123 Benign neoplasm of transverse colon: Secondary | ICD-10-CM | POA: Diagnosis not present

## 2023-07-13 DIAGNOSIS — Z1211 Encounter for screening for malignant neoplasm of colon: Secondary | ICD-10-CM | POA: Diagnosis not present

## 2023-07-13 DIAGNOSIS — K635 Polyp of colon: Secondary | ICD-10-CM | POA: Diagnosis not present

## 2023-07-13 MED ORDER — SODIUM CHLORIDE 0.9 % IV SOLN: 500.0000 mL | INTRAVENOUS | Status: DC

## 2023-07-13 NOTE — Progress Notes (Signed)
 Called to room to assist during endoscopic procedure.  Patient ID and intended procedure confirmed with present staff. Received instructions for my participation in the procedure from the performing physician.

## 2023-07-13 NOTE — Op Note (Addendum)
 [facility] Patient Name: [name] [name] Procedure Date: 07/13/2023 11:19 AM MRN: [id number] Endoscopist: Kenney Peacemaker , MD, [id number] Age: 73 Referring MD:  Date of Birth: 24-Nov-1950 Gender: Female Account #: [id number] Procedure:                Colonoscopy Indications:              Screening for colorectal malignant neoplasm Medicines:                Monitored Anesthesia Care Procedure:                Pre-Anesthesia Assessment:                           - Prior to the procedure, a History and Physical                            was performed, and patient medications and                            allergies were reviewed. The patient's tolerance of                            previous anesthesia was also reviewed. The risks                            and benefits of the procedure and the sedation                            options and risks were discussed with the patient.                            All questions were answered, and informed consent                            was obtained. Prior Anticoagulants: The patient has                            taken no anticoagulant or antiplatelet agents. ASA                            Grade Assessment: II - A patient with mild systemic                            disease. After reviewing the risks and benefits,                            the patient was deemed in satisfactory condition to                            undergo the procedure.                           After obtaining informed consent, the colonoscope  was passed under direct vision. Throughout the                            procedure, the patient's blood pressure, pulse, and                            oxygen  saturations were monitored continuously. The                            CF [id number] #[id number] was introduced through the anus                            and advanced to the the cecum, identified by                            appendiceal  orifice and ileocecal valve. The                            colonoscopy was performed with moderate difficulty                            due to significant looping. Successful completion                            of the procedure was aided by abdominal binder. The                            patient tolerated the procedure well. The quality                            of the bowel preparation was good. The ileocecal                            valve, appendiceal orifice, and rectum were                            photographed. The bowel preparation used was SUPREP                            via split dose instruction. Scope In: 11:23:59 AM Scope Out: 11:47:07 AM Scope Withdrawal Time: 0 hours 7 minutes 46 seconds  Total Procedure Duration: 0 hours 23 minutes 8 seconds  Findings:                 The perianal and digital rectal examinations were                            normal.                           A 15 mm polyp was found in the distal transverse                            colon. The polyp was pedunculated. The polyp was  removed with a hot snare. Removed during insertion                            of colonoscope and removed with suction on scope                            and withdrawing the scope. Resection and retrieval                            were complete. Verification of patient                            identification for the specimen was done. Estimated                            blood loss: none.                           A 2 mm polyp was found in the cecum. The polyp was                            sessile. The polyp was removed with a cold biopsy                            forceps. Resection and retrieval were complete.                            Verification of patient identification for the                            specimen was done. Estimated blood loss was minimal.                           Multiple diverticula were found in the sigmoid                             colon, descending colon and ascending colon.                           The colon was moderately redundant. Abdominal binder                           The exam was otherwise without abnormality on                            direct and retroflexion views. Complications:            No immediate complications. Estimated Blood Loss:     Estimated blood loss was minimal. Impression:               - One 15 mm polyp in the distal transverse colon,                            removed with a hot snare. Resected and retrieved.                           -  One 2 mm polyp in the cecum, removed with a cold                            biopsy forceps. Resected and retrieved.                           - Diverticulosis in the sigmoid colon, in the                            descending colon and in the ascending colon.                           - Redundant colon. Used abdominal binder to                            facilitate scope passage.                           - The examination was otherwise normal on direct                            and retroflexion views. Recommendation:           - Patient has a contact number available for                            emergencies. The signs and symptoms of potential                            delayed complications were discussed with the                            patient. Return to normal activities tomorrow.                            Written discharge instructions were provided to the                            patient.                           - Resume previous diet.                           - Continue present medications.                           - No aspirin, ibuprofen , naproxen, or other                            non-steroidal anti-inflammatory drugs for 2 weeks                            after polyp removal. Kenney Peacemaker, MD 07/13/2023 12:00:42 PM This report has been signed electronically.

## 2023-07-13 NOTE — Progress Notes (Signed)
 To pacu, VSS. Report to [email or web address]

## 2023-07-13 NOTE — Patient Instructions (Addendum)
 Two polyps removed today. Complete exam.  Also have diverticulosis.  I will let you know pathology results and when to have another routine colonoscopy by mail and/or My Chart.  I appreciate the opportunity to care for you. Kenney Peacemaker, MD, [facility]  Thank you for letting us  take care of your healthcare needs today. Please see handouts given to you on Polyps and Diverticulosis.  YOU HAD AN ENDOSCOPIC PROCEDURE TODAY AT THE [facility]:   Refer to the procedure report that was given to you for any specific questions about what was found during the examination.  If the procedure report does not answer your questions, please call your gastroenterologist to clarify.  If you requested that your care partner not be given the details of your procedure findings, then the procedure report has been included in a sealed envelope for you to review at your convenience later.  YOU SHOULD EXPECT: Some feelings of bloating in the abdomen. Passage of more gas than usual.  Walking can help get rid of the air that was put into your GI tract during the procedure and reduce the bloating. If you had a lower endoscopy (such as a colonoscopy or flexible sigmoidoscopy) you may notice spotting of blood in your stool or on the toilet paper. If you underwent a bowel prep for your procedure, you may not have a normal bowel movement for a few days.  Please Note:  You might notice some irritation and congestion in your nose or some drainage.  This is from the oxygen  used during your procedure.  There is no need for concern and it should clear up in a day or so.  SYMPTOMS TO REPORT IMMEDIATELY:  Following lower endoscopy (colonoscopy or flexible sigmoidoscopy):  Excessive amounts of blood in the stool  Significant tenderness or worsening of abdominal pains  Swelling of the abdomen that is new, acute  Fever of 100F or higher   Black, tarry-looking stools  For urgent or emergent issues, a gastroenterologist  can be reached at any hour by calling [phone]. Do not use MyChart messaging for urgent concerns.    DIET:  We do recommend a small meal at first, but then you may proceed to your regular diet.  Drink plenty of fluids but you should avoid alcoholic beverages for 24 hours.  ACTIVITY:  You should plan to take it easy for the rest of today and you should NOT DRIVE or use heavy machinery until tomorrow (because of the sedation medicines used during the test).    FOLLOW UP: Our staff will call the number listed on your records the next business day following your procedure.  We will call around 7:15- 8:00 am to check on you and address any questions or concerns that you may have regarding the information given to you following your procedure. If we do not reach you, we will leave a message.     If any biopsies were taken you will be contacted by phone or by letter within the next 1-3 weeks.  Please call us  at [phone] if you have not heard about the biopsies in 3 weeks.    SIGNATURES/CONFIDENTIALITY: You and/or your care partner have signed paperwork which will be entered into your electronic medical record.  These signatures attest to the fact that that the information above on your After Visit Summary has been reviewed and is understood.  Full responsibility of the confidentiality of this discharge information lies with you and/or your care-partner.

## 2023-07-14 ENCOUNTER — Telehealth: Payer: Self-pay

## 2023-07-14 NOTE — Telephone Encounter (Signed)
  Follow up Call-     07/13/2023   10:14 AM  Call back number  Post procedure Call Back phone  # [phone]  Permission to leave phone message Yes     Patient questions:  Do you have a fever, pain , or abdominal swelling? No. Pain Score  0 *  Have you tolerated food without any problems? Yes.    Have you been able to return to your normal activities? Yes.    Do you have any questions about your discharge instructions: Diet   No. Medications  No. Follow up visit  No.  Do you have questions or concerns about your Care? No.  Actions: * If pain score is 4 or above: No action needed, pain <4.

## 2023-07-18 ENCOUNTER — Ambulatory Visit: Payer: Self-pay | Admitting: Internal Medicine

## 2023-07-18 LAB — SURGICAL PATHOLOGY

## 2023-07-21 ENCOUNTER — Encounter: Payer: Self-pay | Admitting: Internal Medicine

## 2024-04-06 NOTE — Progress Notes (Shared)
 " [facility] - Clinic Note  04/07/2024   CHIEF COMPLAINT Patient presents for No chief complaint on file.  HISTORY OF PRESENT ILLNESS: [name] [name] is a 74 y.o. female who presents to the clinic today for:   Referring physician: Nikki Hansel Atlas, [name] [name] [address] [place],  [place] [address]  HISTORICAL INFORMATION:  Selected notes from the MEDICAL RECORD NUMBER Referred by Dr. JAMA:  Ocular Hx- PMH-   CURRENT MEDICATIONS: No current outpatient medications on file. (Ophthalmic Drugs)   No current facility-administered medications for this visit. (Ophthalmic Drugs)   Current Outpatient Medications (Other)  Medication Sig   albuterol  (PROAIR  HFA) 108 (90 Base) MCG/ACT inhaler Inhale 2 puffs into the lungs every 4 (four) hours as needed for wheezing or shortness of breath.   ascorbic acid  (VITAMIN C) 500 MG tablet Take 1 tablet (500 mg total) by mouth daily.   Blood Glucose Monitoring Suppl (GLUCOCOM BLOOD GLUCOSE MONITOR) DEVI See admin instructions.   calcium  carbonate (OS-CAL) 600 MG TABS Take 600 mg by mouth daily.   cetirizine (ZYRTEC) 10 MG tablet Take 10 mg by mouth.   Cholecalciferol  (VITAMIN D3) 1000 UNITS tablet Take 1,000 Units by mouth daily.     fish oil-omega-3 fatty acids 1000 MG capsule Take 1 g by mouth daily.    fluticasone  (FLONASE ) 50 MCG/ACT nasal spray Place 1 spray into both nostrils daily as needed for allergies.    gabapentin (NEURONTIN) 300 MG capsule Take 300 mg by mouth. 1 in the morning and 2 at night   metFORMIN  (GLUCOPHAGE -XR) 500 MG 24 hr tablet Take 500 mg by mouth in the morning and at bedtime.    Multiple Vitamin (MULTIVITAMIN) tablet Take 1 tablet by mouth daily.   Multiple Vitamins-Minerals (EMERGEN-C IMMUNE PO) Take 1,000-2,000 mg by mouth See admin instructions. Take 2000mg  in the morning, 2000mg  in the afternoon and 1000mg  at night (Patient not taking: Reported on 07/13/2023)   Multiple Vitamins-Minerals (ZINC  PO)  Take 1 capsule by mouth daily. (Patient not taking: Reported on 07/13/2023)   omeprazole  (PRILOSEC) 20 MG capsule Take 1 capsule (20 mg total) by mouth daily.   ONETOUCH ULTRA test strip SMARTSIG:Strip(s) Twice Daily   raloxifene (EVISTA) 60 MG tablet Take 1 tablet by mouth daily.   rosuvastatin (CRESTOR) 5 MG tablet Take 5 mg by mouth.   SALINE NA Place 1 packet into the nose 2 (two) times daily as needed (dry nose). (Patient not taking: Reported on 07/13/2023)   triamcinolone cream (KENALOG) 0.1 % APPLY TWICE DAILY AS NEEDED TO THE AFFECTED AREAS   Wheat Dextrin (BENEFIBER PO) Take 1 Dose by mouth daily.   No current facility-administered medications for this visit. (Other)   REVIEW OF SYSTEMS:  ALLERGIES Allergies[1] PAST MEDICAL HISTORY Past Medical History:  Diagnosis Date   Allergic rhinitis    Allergy    Arthritis    neck   Bone spur    neck   Cataract    L. eye   Chronic sinusitis    CT sinuses 2012, moderately severe.   COVID    Diplopia    Diverticulosis    patient denies   GERD (gastroesophageal reflux disease)    Herniated lumbar intervertebral disc    Hyperplastic colonic polyp 2007   Hypoglycemia    Ligament tear of lower extremity    L. foot Dr. Verena   Lumbago    and radiculopathy   Mild persistent asthma  Pneumonia    Prediabetes    Past Surgical History:  Procedure Laterality Date   ANTERIOR (CYSTOCELE) AND POSTERIOR REPAIR (RECTOCELE) WITH XENFORM GRAFT AND SACROSPINOUS FIXATION N/A 12/25/2014   Procedure: ANTERIOR (CYSTOCELE) AND POSTERIOR REPAIR (RECTOCELE) ;  Surgeon: [name] Lares, [name] [name];  Location: [facility];  Service: Gynecology;  Laterality: N/A;   COLONOSCOPY W/ POLYPECTOMY  11/2005   INCOMPLETE - to transverse colon  - diverticulosis, 4mm polyp was hyperplastic   FOOT SURGERY Left    left - Dr. Vickye 2009   LAPAROSCOPIC SALPINGOOPHERECTOMY  05/05/2006   LSO for complex left adnexal mass:  PATH--serous cystadenofibroma   SHOULDER SURGERY  Left    impingement syndrome   TUBAL LIGATION     UPPER GASTROINTESTINAL ENDOSCOPY  06/2002   with dilation, esophageal stenosis   VAGINAL HYSTERECTOMY N/A 12/25/2014   Procedure: HYSTERECTOMY VAGINAL;  Surgeon: [name] Lares, [name] [name];  Location: [facility];  Service: Gynecology;  Laterality: N/A;   FAMILY HISTORY Family History  Problem Relation Age of Onset   Heart disease Mother 87       CHF with valvular dz   Multiple myeloma Mother    Diverticulitis Mother    Diabetes Mother    Cancer Mother        multiple myel   Bladder Cancer Father    Kidney failure Father    Heart disease Brother        one with aortic valve dz and one with CAD   Other Brother        COP   Dementia Brother    Colon cancer Paternal Uncle    Tuberculosis Maternal Grandmother    Tuberculosis Maternal Grandfather    Endometriosis Daughter    Anxiety disorder Daughter    Breast cancer Other        cousin   Lung cancer Other        uncle   Esophageal cancer Neg Hx    Rectal cancer Neg Hx    Stomach cancer Neg Hx    SOCIAL HISTORY Social History[2]     OPHTHALMIC EXAM:  Not recorded    IMAGING AND PROCEDURES  Imaging and Procedures for 04/07/2024        ASSESSMENT/PLAN: No diagnosis found. 1.  2.  3.  Ophthalmic Meds Ordered this visit:  No orders of the defined types were placed in this encounter.    No follow-ups on file.  There are no Patient Instructions on file for this visit.  Explained the diagnoses, plan, and follow up with the patient and they expressed understanding.  Patient expressed understanding of the importance of proper follow up care.   This document serves as a record of services personally performed by [name] JUDITHANN Hans, [name] [name], [name] [name]. It was created on their behalf by [name] [name], an ophthalmic technician. The creation of this record is the provider's dictation and/or activities during the visit.    Electronically signed by: [name] [name], OA, 04/06/24  3:53  PM  This document serves as a record of services personally performed by [name] JUDITHANN Hans, [name] [name], [name] [name]. It was created on their behalf by [name] [name], an ophthalmic technician. The creation of this record is the provider's dictation and/or activities during the visit.    Electronically signed by: [name] [name], OA, 04/07/24  7:56 AM    [name] [name], M.D., Ph.D. Diseases & Surgery of the Retina and Vitreous [facility] 04/07/2024  Abbreviations: M myopia (nearsighted); A astigmatism; H hyperopia (farsighted);  P presbyopia; Mrx spectacle prescription;  CTL contact lenses; OD right eye; OS left eye; OU both eyes  XT exotropia; ET esotropia; PEK punctate epithelial keratitis; PEE punctate epithelial erosions; DES dry eye syndrome; MGD meibomian gland dysfunction; ATs artificial tears; PFAT's preservative free artificial tears; NSC nuclear sclerotic cataract; PSC posterior subcapsular cataract; ERM epi-retinal membrane; PVD posterior vitreous detachment; RD retinal detachment; DM diabetes mellitus; DR diabetic retinopathy; NPDR non-proliferative diabetic retinopathy; PDR proliferative diabetic retinopathy; CSME clinically significant macular edema; DME diabetic macular edema; dbh dot blot hemorrhages; CWS cotton wool spot; POAG primary open angle glaucoma; C/D cup-to-disc ratio; HVF humphrey visual field; GVF goldmann visual field; OCT optical coherence tomography; IOP intraocular pressure; BRVO Branch retinal vein occlusion; CRVO central retinal vein occlusion; CRAO central retinal artery occlusion; BRAO branch retinal artery occlusion; RT retinal tear; SB scleral buckle; PPV pars plana vitrectomy; VH Vitreous hemorrhage; PRP panretinal laser photocoagulation; IVK intravitreal kenalog; VMT vitreomacular traction; MH Macular hole;  NVD neovascularization of the disc; NVE neovascularization elsewhere; AREDS age related eye disease study; ARMD age related macular degeneration; POAG  primary open angle glaucoma; EBMD epithelial/anterior basement membrane dystrophy; ACIOL anterior chamber intraocular lens; IOL intraocular lens; PCIOL posterior chamber intraocular lens; Phaco/IOL phacoemulsification with intraocular lens placement; PRK photorefractive keratectomy; LASIK laser assisted in situ keratomileusis; HTN hypertension; DM diabetes mellitus; COPD chronic obstructive pulmonary disease      [1]  Allergies Allergen Reactions   Ciprofloxacin Other (See Comments)    Pt. Reports a tendon issue s/p cipro   Sulfonamide Derivatives Other (See Comments)    Bloating stomach   Chlorhexidine  Dermatitis and Rash   Latex Itching   Meloxicam Other (See Comments)    Gas, bloating, abdominal discomfort  [2]  Social History Tobacco Use   Smoking status: Never   Smokeless tobacco: Never  Vaping Use   Vaping status: Never Used  Substance Use Topics   Alcohol use: No   Drug use: No   "

## 2024-04-07 ENCOUNTER — Encounter (INDEPENDENT_AMBULATORY_CARE_PROVIDER_SITE_OTHER): Admitting: Ophthalmology

## 2024-04-07 DIAGNOSIS — H3581 Retinal edema: Secondary | ICD-10-CM

## 2024-04-07 NOTE — Progress Notes (Shared)
 " [facility] - Clinic Note  04/08/2024   CHIEF COMPLAINT Patient presents for Retina Evaluation  HISTORY OF PRESENT ILLNESS: [name] [name] is a 74 y.o. female who presents to the clinic today for:  HPI     Retina Evaluation   In both eyes.  Associated Symptoms Negative for Flashes, Floaters, Distortion, Blind Spot, Pain, Redness, Photophobia, Glare, Trauma, Scalp Tenderness, Jaw Claudication, Shoulder/Hip pain, Fever, Weight Loss and Fatigue.  Context:  distance vision.  Response to treatment was no improvement.        Comments   Pt states she went to [place] due to double vision for the past year and he discovered she may have some tears in her retina. Pt denies FOL/floaters/pain.Pt does not need ATS. Pt states the double vision is only noticed DVA, no double vision NVA and disappears if she covers one eye. Pt has been on metformin  for 2 years, and diagnosed as prediabtic. A1c=6.1, 1 month ago BS=usually in the mid 90's fasting am.      Last edited by Elnor Avelina RAMAN, COT on 04/08/2024  9:56 AM.     Patient states her vision is doing well.   Referring physician: Octavia Bruckner, MD [address] [place],  [place] [address]  HISTORICAL INFORMATION:  Selected notes from the MEDICAL RECORD NUMBER Referred by Dr. JAMA:  Ocular Hx- PMH-   CURRENT MEDICATIONS: No current outpatient medications on file. (Ophthalmic Drugs)   No current facility-administered medications for this visit. (Ophthalmic Drugs)   Current Outpatient Medications (Other)  Medication Sig   albuterol  (PROAIR  HFA) 108 (90 Base) MCG/ACT inhaler Inhale 2 puffs into the lungs every 4 (four) hours as needed for wheezing or shortness of breath.   ascorbic acid  (VITAMIN C) 500 MG tablet Take 1 tablet (500 mg total) by mouth daily.   Blood Glucose Monitoring Suppl (GLUCOCOM BLOOD GLUCOSE MONITOR) DEVI See admin instructions.   calcium  carbonate (OS-CAL) 600 MG TABS Take 600 mg by mouth  daily.   cetirizine (ZYRTEC) 10 MG tablet Take 10 mg by mouth.   Cholecalciferol  (VITAMIN D3) 1000 UNITS tablet Take 1,000 Units by mouth daily.     fish oil-omega-3 fatty acids 1000 MG capsule Take 1 g by mouth daily.    fluticasone  (FLONASE ) 50 MCG/ACT nasal spray Place 1 spray into both nostrils daily as needed for allergies.    gabapentin (NEURONTIN) 300 MG capsule Take 300 mg by mouth. 1 in the morning and 2 at night   metFORMIN  (GLUCOPHAGE -XR) 500 MG 24 hr tablet Take 500 mg by mouth in the morning and at bedtime.    Multiple Vitamin (MULTIVITAMIN) tablet Take 1 tablet by mouth daily.   Multiple Vitamins-Minerals (EMERGEN-C IMMUNE PO) Take 1,000-2,000 mg by mouth See admin instructions. Take 2000mg  in the morning, 2000mg  in the afternoon and 1000mg  at night (Patient not taking: Reported on 07/13/2023)   Multiple Vitamins-Minerals (ZINC  PO) Take 1 capsule by mouth daily. (Patient not taking: Reported on 07/13/2023)   omeprazole  (PRILOSEC) 20 MG capsule Take 1 capsule (20 mg total) by mouth daily.   ONETOUCH ULTRA test strip SMARTSIG:Strip(s) Twice Daily   raloxifene (EVISTA) 60 MG tablet Take 1 tablet by mouth daily.   rosuvastatin (CRESTOR) 5 MG tablet Take 5 mg by mouth.   SALINE NA Place 1 packet into the nose 2 (two) times daily as needed (dry nose). (Patient not taking: Reported on 07/13/2023)   triamcinolone cream (KENALOG) 0.1 % APPLY TWICE  DAILY AS NEEDED TO THE AFFECTED AREAS   Wheat Dextrin (BENEFIBER PO) Take 1 Dose by mouth daily.   No current facility-administered medications for this visit. (Other)   REVIEW OF SYSTEMS: ROS   Positive for: Musculoskeletal, Endocrine Last edited by Elnor Avelina RAMAN, COT on 04/08/2024  9:46 AM.     ALLERGIES Allergies[1] PAST MEDICAL HISTORY Past Medical History:  Diagnosis Date   Allergic rhinitis    Allergy    Arthritis    neck   Bone spur    neck   Cataract    L. eye   Chronic sinusitis    CT sinuses 2012, moderately severe.    COVID    Diplopia    Diverticulosis    patient denies   GERD (gastroesophageal reflux disease)    Herniated lumbar intervertebral disc    Hyperplastic colonic polyp 2007   Hypoglycemia    Ligament tear of lower extremity    L. foot Dr. Verena   Lumbago    and radiculopathy   Mild persistent asthma    Pneumonia    Prediabetes    Past Surgical History:  Procedure Laterality Date   ANTERIOR (CYSTOCELE) AND POSTERIOR REPAIR (RECTOCELE) WITH XENFORM GRAFT AND SACROSPINOUS FIXATION N/A 12/25/2014   Procedure: ANTERIOR (CYSTOCELE) AND POSTERIOR REPAIR (RECTOCELE) ;  Surgeon: Debby Lares, MD;  Location: [facility];  Service: Gynecology;  Laterality: N/A;   COLONOSCOPY W/ POLYPECTOMY  11/2005   INCOMPLETE - to transverse colon  - diverticulosis, 4mm polyp was hyperplastic   FOOT SURGERY Left    left - Dr. Vickye 2009   LAPAROSCOPIC SALPINGOOPHERECTOMY  05/05/2006   LSO for complex left adnexal mass:  PATH--serous cystadenofibroma   SHOULDER SURGERY Left    impingement syndrome   TUBAL LIGATION     UPPER GASTROINTESTINAL ENDOSCOPY  06/2002   with dilation, esophageal stenosis   VAGINAL HYSTERECTOMY N/A 12/25/2014   Procedure: HYSTERECTOMY VAGINAL;  Surgeon: Debby Lares, MD;  Location: [facility];  Service: Gynecology;  Laterality: N/A;   FAMILY HISTORY Family History  Problem Relation Age of Onset   Heart disease Mother 33       CHF with valvular dz   Multiple myeloma Mother    Diverticulitis Mother    Diabetes Mother    Cancer Mother        multiple myel   Bladder Cancer Father    Kidney failure Father    Heart disease Brother        one with aortic valve dz and one with CAD   Other Brother        COP   Dementia Brother    Colon cancer Paternal Uncle    Tuberculosis Maternal Grandmother    Tuberculosis Maternal Grandfather    Endometriosis Daughter    Anxiety disorder Daughter    Breast cancer Other        cousin   Lung cancer Other        uncle   Esophageal cancer  Neg Hx    Rectal cancer Neg Hx    Stomach cancer Neg Hx    SOCIAL HISTORY Social History[2]     OPHTHALMIC EXAM:  Base Eye Exam     Visual Acuity (Snellen - Linear)       Right Left   Dist cc 20/25 -2 20/30 -2   Dist ph cc 20/20 20/20 -2    Correction: Glasses         Tonometry (Tonopen, 9:51 AM)  Right Left   Pressure 16 17         Pupils       Pupils Dark Light Shape React APD   Right PERRL 4 2 Round Brisk None   Left PERRL 4 2 Round Brisk None         Visual Fields       Left Right    Full Full         Extraocular Movement       Right Left    Full, Ortho Full, Ortho         Neuro/Psych     Oriented x3: Yes         Dilation     Both eyes: 1.0% Mydriacyl, 2.5% Phenylephrine  @ 9:51 AM           Slit Lamp and Fundus Exam     External Exam       Right Left   External Normal Normal         Slit Lamp Exam       Right Left   Lids/Lashes Dermatochalasis - upper lid, Meibomian gland dysfunction Dermatochalasis - upper lid, Meibomian gland dysfunction   Conjunctiva/Sclera White and quiet White and quiet   Cornea Debris in tear film Debris in tear film   Anterior Chamber Deep and clear Deep and clear   Iris Round and dilated, No NVI Round and dilated, No NVI   Lens 2+ Nuclear sclerosis, 2+ Cortical cataract 2+ Nuclear sclerosis, 2+ Cortical cataract   Anterior Vitreous Vitreous syneresis, Posterior vitreous detachment Vitreous syneresis, Posterior vitreous detachment         Fundus Exam       Right Left   Disc Pink and sharp Pink and sharp   C/D Ratio 0.2 0.2   Macula Flat, Blunted foveal reflex, RPE mottling and mild clumping Flat, Good foveal reflex, RPE mottling, No heme or edema   Vessels Vascular attenuation, Tortuous Vascular attenuation, Tortuous   Periphery Atropic hole with mild pigment surrounding 0800, pigmented CR atrophy 1030 Focal pigmented CR scarring 1215 with vit condensations overlying no SRF            Refraction     Wearing Rx       Sphere Cylinder Axis Add   Right +0.75 Sphere  +2.75   Left -1.25 +0.75 078 +2.75    Age: 89 years   Type: Bifocal           IMAGING AND PROCEDURES  Imaging and Procedures for 04/08/2024  OCT, Retina - OU - Both Eyes       Right Eye Quality was good. Central Foveal Thickness: 272. Progression has no prior data. Findings include normal foveal contour, no IRF, no SRF, retinal drusen (Rare drusen, no DME).   Left Eye Quality was good. Central Foveal Thickness: 275. Progression has no prior data. Findings include normal foveal contour, no IRF, no SRF, retinal drusen (Rare drusen, no DME).   Notes *Images captured and stored on drive  Diagnosis / Impression:  OU: Rare drusen, no DME   Clinical management:  See below  Abbreviations: NFP - Normal foveal profile. CME - cystoid macular edema. PED - pigment epithelial detachment. IRF - intraretinal fluid. SRF - subretinal fluid. EZ - ellipsoid zone. ERM - epiretinal membrane. ORA - outer retinal atrophy. ORT - outer retinal tubulation. SRHM - subretinal hyper-reflective material. IRHM - intraretinal hyper-reflective material           ASSESSMENT/PLAN:  ICD-10-CM   1. Retinal hole of right eye  H33.321     2. Left retinal defect  H33.302     3. Diabetes mellitus without complication (HCC)  E11.9 OCT, Retina - OU - Both Eyes    4. Diabetes mellitus treated with oral medication (HCC)  E11.9    Z79.84     5. Combined forms of age-related cataract of both eyes  H25.813      1,2. Retinal tear, OD, Retinal defect OS - The incidence, risk factors, and natural history of retinal tear was discussed with patient.   - Potential treatment options including laser retinopexy and cryotherapy discussed with patient. - OD- Atropic hole with mild pigment surrounding at 0800 - OS - Focal pigmented CR scarring 1215 with vit condensations overlying no SRF - recommend laser retinopexy in both  eyes, OD will be scheduled first - RBA of procedure discussed, questions answered - f/u in 02.11.26 DFE, OCT, Retinopexy OD   3,4.  Diabetes mellitus, type 2 without retinopathy  - A1C 6.1 (12.11.25) - The incidence, risk factors for progression, natural history and treatment options for diabetic retinopathy  were discussed with patient.   - The need for close monitoring of blood glucose, blood pressure, and serum lipids, avoiding cigarette or any type of tobacco, and the need for long term follow up was also discussed with patient.   5. Mixed Cataract OU - The symptoms of cataract, surgical options, and treatments and risks were discussed with patient. - discussed diagnosis and progression - under the expert care of Dr. Medford Gaudy - monitor   Ophthalmic Meds Ordered this visit:  No orders of the defined types were placed in this encounter.    No follow-ups on file.  There are no Patient Instructions on file for this visit.  This document serves as a record of services personally performed by Redell JUDITHANN Hans, MD, PhD. It was created on their behalf by Delon Newness COT, an ophthalmic technician. The creation of this record is the provider's dictation and/or activities during the visit.    Electronically signed by: Delon Newness COT 02.05.26  11:12 AM  This document serves as a record of services personally performed by Redell JUDITHANN Hans, MD, PhD. It was created on their behalf by Wanda GEANNIE Keens, COT an ophthalmic technician. The creation of this record is the provider's dictation and/or activities during the visit.    Electronically signed by:  Wanda GEANNIE Keens, COT  04/08/24 11:12 AM   Redell JUDITHANN Hans, M.D., Ph.D. Diseases & Surgery of the Retina and Vitreous [facility] 04/08/2024  Abbreviations: M myopia (nearsighted); A astigmatism; H hyperopia (farsighted); P presbyopia; Mrx spectacle prescription;  CTL contact lenses; OD right eye;  OS left eye; OU both eyes  XT exotropia; ET esotropia; PEK punctate epithelial keratitis; PEE punctate epithelial erosions; DES dry eye syndrome; MGD meibomian gland dysfunction; ATs artificial tears; PFAT's preservative free artificial tears; NSC nuclear sclerotic cataract; PSC posterior subcapsular cataract; ERM epi-retinal membrane; PVD posterior vitreous detachment; RD retinal detachment; DM diabetes mellitus; DR diabetic retinopathy; NPDR non-proliferative diabetic retinopathy; PDR proliferative diabetic retinopathy; CSME clinically significant macular edema; DME diabetic macular edema; dbh dot blot hemorrhages; CWS cotton wool spot; POAG primary open angle glaucoma; C/D cup-to-disc ratio; HVF humphrey visual field; GVF goldmann visual field; OCT optical coherence tomography; IOP intraocular pressure; BRVO Branch retinal vein occlusion; CRVO central retinal vein occlusion; CRAO central retinal artery occlusion; BRAO branch retinal artery occlusion; RT  retinal tear; SB scleral buckle; PPV pars plana vitrectomy; VH Vitreous hemorrhage; PRP panretinal laser photocoagulation; IVK intravitreal kenalog; VMT vitreomacular traction; MH Macular hole;  NVD neovascularization of the disc; NVE neovascularization elsewhere; AREDS age related eye disease study; ARMD age related macular degeneration; POAG primary open angle glaucoma; EBMD epithelial/anterior basement membrane dystrophy; ACIOL anterior chamber intraocular lens; IOL intraocular lens; PCIOL posterior chamber intraocular lens; Phaco/IOL phacoemulsification with intraocular lens placement; PRK photorefractive keratectomy; LASIK laser assisted in situ keratomileusis; HTN hypertension; DM diabetes mellitus; COPD chronic obstructive pulmonary disease      [1]  Allergies Allergen Reactions   Ciprofloxacin Other (See Comments)    Pt. Reports a tendon issue s/p cipro   Sulfonamide Derivatives Other (See Comments)    Bloating stomach   Chlorhexidine  Dermatitis  and Rash   Latex Itching   Meloxicam Other (See Comments)    Gas, bloating, abdominal discomfort  [2]  Social History Tobacco Use   Smoking status: Never   Smokeless tobacco: Never  Vaping Use   Vaping status: Never Used  Substance Use Topics   Alcohol use: No   Drug use: No   "

## 2024-04-08 ENCOUNTER — Encounter (INDEPENDENT_AMBULATORY_CARE_PROVIDER_SITE_OTHER): Payer: Self-pay | Admitting: Ophthalmology

## 2024-04-08 ENCOUNTER — Ambulatory Visit (INDEPENDENT_AMBULATORY_CARE_PROVIDER_SITE_OTHER): Admitting: Ophthalmology

## 2024-04-08 DIAGNOSIS — H33302 Unspecified retinal break, left eye: Secondary | ICD-10-CM

## 2024-04-08 DIAGNOSIS — E119 Type 2 diabetes mellitus without complications: Secondary | ICD-10-CM

## 2024-04-08 DIAGNOSIS — H25813 Combined forms of age-related cataract, bilateral: Secondary | ICD-10-CM

## 2024-04-08 DIAGNOSIS — H33321 Round hole, right eye: Secondary | ICD-10-CM

## 2024-04-13 ENCOUNTER — Encounter (INDEPENDENT_AMBULATORY_CARE_PROVIDER_SITE_OTHER): Admitting: Ophthalmology
